# Patient Record
Sex: Male | Born: 1940
Health system: Southern US, Community
[De-identification: ages and names within clinical notes are randomized; demographics above are authoritative.]

## PROBLEM LIST (undated history)

## (undated) DIAGNOSIS — I83812 Varicose veins of left lower extremities with pain: Secondary | ICD-10-CM

## (undated) DIAGNOSIS — F419 Anxiety disorder, unspecified: Secondary | ICD-10-CM

## (undated) DIAGNOSIS — K5790 Diverticulosis of intestine, part unspecified, without perforation or abscess without bleeding: Secondary | ICD-10-CM

## (undated) DIAGNOSIS — H209 Unspecified iridocyclitis: Secondary | ICD-10-CM

## (undated) DIAGNOSIS — E7800 Pure hypercholesterolemia, unspecified: Secondary | ICD-10-CM

## (undated) DIAGNOSIS — I82409 Acute embolism and thrombosis of unspecified deep veins of unspecified lower extremity: Secondary | ICD-10-CM

## (undated) DIAGNOSIS — I2510 Atherosclerotic heart disease of native coronary artery without angina pectoris: Secondary | ICD-10-CM

## (undated) DIAGNOSIS — I452 Bifascicular block: Secondary | ICD-10-CM

## (undated) DIAGNOSIS — I209 Angina pectoris, unspecified: Secondary | ICD-10-CM

## (undated) DIAGNOSIS — G250 Essential tremor: Secondary | ICD-10-CM

## (undated) DIAGNOSIS — N400 Enlarged prostate without lower urinary tract symptoms: Secondary | ICD-10-CM

## (undated) DIAGNOSIS — Z87442 Personal history of urinary calculi: Secondary | ICD-10-CM

## (undated) DIAGNOSIS — I519 Heart disease, unspecified: Secondary | ICD-10-CM

## (undated) HISTORY — DX: Bifascicular block: I45.2

## (undated) HISTORY — PX: INGUINAL HERNIA REPAIR: SUR1180

## (undated) HISTORY — DX: Essential tremor: G25.0

## (undated) HISTORY — DX: Benign prostatic hyperplasia without lower urinary tract symptoms: N40.0

## (undated) HISTORY — DX: Heart disease, unspecified: I51.9

## (undated) HISTORY — DX: Anxiety disorder, unspecified: F41.9

## (undated) HISTORY — DX: Diverticulosis of intestine, part unspecified, without perforation or abscess without bleeding: K57.90

## (undated) HISTORY — PX: LITHOTRIPSY: SUR834

## (undated) HISTORY — PX: UMBILICAL HERNIA REPAIR: SHX196

## (undated) HISTORY — DX: Atherosclerotic heart disease of native coronary artery without angina pectoris: I25.10

## (undated) HISTORY — DX: Unspecified iridocyclitis: H20.9

---
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
## 1988-06-24 DIAGNOSIS — I82409 Acute embolism and thrombosis of unspecified deep veins of unspecified lower extremity: Secondary | ICD-10-CM

## 1988-06-24 HISTORY — DX: Acute embolism and thrombosis of unspecified deep veins of unspecified lower extremity: I82.409

## 1995-06-25 HISTORY — PX: CORONARY ANGIOPLASTY: SHX604

## 1995-06-25 HISTORY — PX: CARDIAC CATHETERIZATION: SHX172

## 1998-05-24 ENCOUNTER — Observation Stay (HOSPITAL_COMMUNITY): Admission: AD | Admit: 1998-05-24 | Discharge: 1998-05-25 | Payer: Self-pay | Admitting: Cardiology

## 2004-01-12 ENCOUNTER — Encounter: Admission: RE | Admit: 2004-01-12 | Discharge: 2004-01-12 | Payer: Self-pay | Admitting: Internal Medicine

## 2008-08-01 ENCOUNTER — Encounter: Payer: Self-pay | Admitting: Cardiology

## 2009-02-02 ENCOUNTER — Encounter: Payer: Self-pay | Admitting: Cardiology

## 2009-02-25 DIAGNOSIS — Z8672 Personal history of thrombophlebitis: Secondary | ICD-10-CM

## 2009-03-02 ENCOUNTER — Ambulatory Visit: Payer: Self-pay | Admitting: Cardiology

## 2009-03-02 DIAGNOSIS — F341 Dysthymic disorder: Secondary | ICD-10-CM | POA: Insufficient documentation

## 2009-03-02 DIAGNOSIS — Z87898 Personal history of other specified conditions: Secondary | ICD-10-CM | POA: Insufficient documentation

## 2009-03-02 DIAGNOSIS — R351 Nocturia: Secondary | ICD-10-CM

## 2009-03-16 ENCOUNTER — Telehealth (INDEPENDENT_AMBULATORY_CARE_PROVIDER_SITE_OTHER): Payer: Self-pay | Admitting: *Deleted

## 2009-03-23 ENCOUNTER — Telehealth (INDEPENDENT_AMBULATORY_CARE_PROVIDER_SITE_OTHER): Payer: Self-pay

## 2009-03-27 ENCOUNTER — Encounter (HOSPITAL_COMMUNITY): Admission: RE | Admit: 2009-03-27 | Discharge: 2009-05-26 | Payer: Self-pay | Admitting: Cardiovascular Disease

## 2009-03-27 ENCOUNTER — Ambulatory Visit: Payer: Self-pay | Admitting: Cardiovascular Disease

## 2009-03-27 ENCOUNTER — Ambulatory Visit: Payer: Self-pay

## 2009-04-06 ENCOUNTER — Ambulatory Visit: Payer: Self-pay | Admitting: Cardiovascular Disease

## 2009-04-06 ENCOUNTER — Encounter: Payer: Self-pay | Admitting: Cardiology

## 2009-04-06 ENCOUNTER — Ambulatory Visit (HOSPITAL_COMMUNITY): Admission: RE | Admit: 2009-04-06 | Discharge: 2009-04-06 | Payer: Self-pay | Admitting: Cardiovascular Disease

## 2009-04-06 ENCOUNTER — Ambulatory Visit: Payer: Self-pay

## 2009-04-13 ENCOUNTER — Telehealth: Payer: Self-pay | Admitting: Cardiology

## 2011-04-08 ENCOUNTER — Other Ambulatory Visit: Payer: Self-pay | Admitting: Gastroenterology

## 2011-04-08 DIAGNOSIS — R1030 Lower abdominal pain, unspecified: Secondary | ICD-10-CM

## 2011-04-10 ENCOUNTER — Ambulatory Visit
Admission: RE | Admit: 2011-04-10 | Discharge: 2011-04-10 | Disposition: A | Discharge status: Home / Self Care | Payer: Medicare Other | Source: Ambulatory Visit | Attending: Gastroenterology | Admitting: Gastroenterology

## 2011-04-10 DIAGNOSIS — R1030 Lower abdominal pain, unspecified: Secondary | ICD-10-CM

## 2011-04-10 MED ORDER — IOHEXOL 300 MG/ML  SOLN: 100.0000 mL | Freq: Once | INTRAMUSCULAR | Status: AC | PRN

## 2011-05-13 ENCOUNTER — Other Ambulatory Visit: Payer: Self-pay | Admitting: Internal Medicine

## 2011-05-13 DIAGNOSIS — I83813 Varicose veins of bilateral lower extremities with pain: Secondary | ICD-10-CM

## 2011-05-29 ENCOUNTER — Other Ambulatory Visit: Payer: Self-pay | Admitting: Internal Medicine

## 2011-05-29 ENCOUNTER — Other Ambulatory Visit: Payer: Medicare Other

## 2011-05-29 ENCOUNTER — Ambulatory Visit
Admission: RE | Admit: 2011-05-29 | Discharge: 2011-05-29 | Disposition: A | Discharge status: Home / Self Care | Payer: Medicare Other | Source: Ambulatory Visit | Attending: Internal Medicine | Admitting: Internal Medicine

## 2011-05-29 VITALS — BP 150/83 | HR 85 | Temp 98.0°F | Resp 14 | Ht 72.0 in | Wt 195.0 lb

## 2011-05-29 DIAGNOSIS — I83813 Varicose veins of bilateral lower extremities with pain: Secondary | ICD-10-CM

## 2011-05-29 HISTORY — DX: Angina pectoris, unspecified: I20.9

## 2011-05-29 HISTORY — DX: Acute embolism and thrombosis of unspecified deep veins of unspecified lower extremity: I82.409

## 2011-05-29 HISTORY — DX: Varicose veins of left lower extremity with pain: I83.812

## 2011-05-29 HISTORY — DX: Pure hypercholesterolemia, unspecified: E78.00

## 2011-06-05 ENCOUNTER — Telehealth: Payer: Self-pay | Admitting: Emergency Medicine

## 2011-06-05 NOTE — Telephone Encounter (Signed)
SPOKE W/ PT RE: NO AUTHO FOR MEDICARE OR BCBC FOR THE VARICOSE VEIN LASER [place].HIS BCBS WILL FOLLOW MEDICARE GUIDELINES AFTER WE FILE THE CLAIM.  PT DECIDED TO THINK ABOUT IT AND WILL TRY THE COMPRESSION STOCKINGS FIRST TO SEE IF THEY HELP ELEVATE THE SYMPTOMS IN HIS LEGS.  HE WILL CALL us NEXT YEAR WHEN HE IS READY TO SCHEDULE.

## 2011-07-11 ENCOUNTER — Telehealth: Payer: Self-pay | Admitting: Emergency Medicine

## 2011-07-11 NOTE — Telephone Encounter (Signed)
Pt. Can not get the compression stockings on his legs and has decided to forfeit the laser treatment until further notice.  He will contact us in the future if things change.

## 2012-01-03 ENCOUNTER — Ambulatory Visit (INDEPENDENT_AMBULATORY_CARE_PROVIDER_SITE_OTHER): Payer: Medicare Other | Admitting: Family Medicine

## 2012-01-03 VITALS — BP 132/76 | HR 76 | Temp 98.1°F | Resp 16 | Ht 72.0 in | Wt 189.0 lb

## 2012-01-03 DIAGNOSIS — M25579 Pain in unspecified ankle and joints of unspecified foot: Secondary | ICD-10-CM

## 2012-01-03 DIAGNOSIS — L03031 Cellulitis of right toe: Secondary | ICD-10-CM

## 2012-01-03 DIAGNOSIS — L03039 Cellulitis of unspecified toe: Secondary | ICD-10-CM

## 2012-01-03 MED ORDER — DOXYCYCLINE HYCLATE 100 MG PO TABS: 100.0000 mg | ORAL_TABLET | Freq: Two times a day (BID) | ORAL | Status: AC

## 2012-01-03 NOTE — Progress Notes (Signed)
This 71 year old retired man who comes in with a right toe pain. He tried to clean out the edge of the cuticle and putting Neosporin several days ago and then the whole toe got red and painful.  Objective: Patient has paronychia around the right great toe    areas prepped with Betadine and the paronychia was incised with a #11 blade releasing or abscess 1-2 mL of thick yellow pus which was cultured. He had relief. Here he was then massaged to empty all the pus and the wound distress.  Assessment: Paronychia  Plan: Doxycycline and return as needed

## 2012-01-03 NOTE — Addendum Note (Signed)
Addended by: Maryann Alar on: 01/03/2012 04:19 PM   Modules accepted: Orders

## 2012-01-06 LAB — WOUND CULTURE
Gram Stain: NONE SEEN
Organism ID, Bacteria: NO GROWTH

## 2012-06-12 ENCOUNTER — Other Ambulatory Visit: Payer: Self-pay | Admitting: Neurology

## 2012-06-12 DIAGNOSIS — M839 Adult osteomalacia, unspecified: Secondary | ICD-10-CM

## 2012-06-22 ENCOUNTER — Ambulatory Visit
Admission: RE | Admit: 2012-06-22 | Discharge: 2012-06-22 | Disposition: A | Discharge status: Home / Self Care | Payer: Medicare Other | Source: Ambulatory Visit | Attending: Neurology | Admitting: Neurology

## 2012-06-22 DIAGNOSIS — M839 Adult osteomalacia, unspecified: Secondary | ICD-10-CM

## 2012-09-23 ENCOUNTER — Telehealth: Payer: Self-pay | Admitting: Neurology

## 2012-09-23 NOTE — Telephone Encounter (Signed)
Pt called and is asking about regimen for tapering his primidone for his tremor.  I instructed him to take 1/2 tab (250mg  tabs) in am, 1 tab pm for 3 wks, then 1/2 tab po bid for 3 wks, then 1/2 tab po qd for 3 wks then discontinue.  He verbalized understanding.

## 2012-10-26 ENCOUNTER — Telehealth: Payer: Self-pay | Admitting: Neurology

## 2012-10-28 ENCOUNTER — Telehealth: Payer: Self-pay | Admitting: Neurology

## 2012-10-28 DIAGNOSIS — G250 Essential tremor: Secondary | ICD-10-CM

## 2012-10-28 MED ORDER — PRIMIDONE 250 MG PO TABS: 250.0000 mg | ORAL_TABLET | Freq: Two times a day (BID) | ORAL | Status: DC

## 2012-10-28 NOTE — Telephone Encounter (Signed)
Patient requests a call re his medication.

## 2012-10-28 NOTE — Telephone Encounter (Signed)
I called pt and he is asking about restarting the primidone again since he has titrated down to 1/2 tab once daily, he has noticed shaking.   I instructed him to start taking the 1/2 tab po bid (primidone 250mg  tabs) until 11/09/12 then increase to 1/2 tab am and 1 tab po pm, then on 11/23/12 take 1 tab po bid.  (this is 2 wk intervals).  He is to remain at 250mg  po bid.  He will call if problems.  I refilled his primidone 250mg  po bid # 60 RF x 2.  Pt has appt on 12/2012 with Dr. Frances Furbish.

## 2013-01-01 ENCOUNTER — Ambulatory Visit (INDEPENDENT_AMBULATORY_CARE_PROVIDER_SITE_OTHER): Payer: Medicare Other | Admitting: Neurology

## 2013-01-01 ENCOUNTER — Encounter: Payer: Self-pay | Admitting: Neurology

## 2013-01-01 VITALS — BP 129/82 | HR 69 | Temp 98.2°F | Ht 72.0 in | Wt 188.0 lb

## 2013-01-01 DIAGNOSIS — G250 Essential tremor: Secondary | ICD-10-CM

## 2013-01-01 NOTE — Patient Instructions (Addendum)
I think overall you are doing fairly well but I do want to suggest a few things today:   Remember to drink plenty of fluid, eat healthy meals and do not skip any meals. Try to eat protein with a every meal and eat a healthy snack such as fruit or nuts in between meals. Try to keep a regular sleep-wake schedule and try to exercise daily, particularly in the form of walking, 20-30 minutes a day, if you can.   Engage in social activities in your community and with your family and try to keep up with current events by reading the newspaper or watching the news.   As far as your medications are concerned, I would like to suggest no changes. Let me know, if you wish to seek consultation for DBS placement. We can refer you to [facility] or [facility]. If you wish to take a smaller dose of Primidone at midday, ie. 50 mg, let me know and we can call it in for you.    As far as diagnostic testing: no new test.  I would like to see you back in 6 months, sooner if we need to. Please call us with any interim questions, concerns, problems, updates or refill requests.  Brett Canales is my clinical assistant and will answer any of your questions and relay your messages to me and also relay most of my messages to you.  Our phone number is [phone]. We also have an after hours call service for urgent matters and there is a physician on-call for urgent questions. For any emergencies you know to call 911 or go to the nearest emergency room.

## 2013-01-01 NOTE — Progress Notes (Signed)
Subjective:    Patient ID: Joseph Hood is a 72 y.o. male.  HPI  Interim history:   Joseph Hood is a very pleasant 72 year old right-handed gentleman who presents for followup consultation of his essential tremor. He is unaccompanied today. This is his first visit with me and she previously followed with Dr. Fayrene Fearing love and was last seen by him on 09/01/2012, and which time Dr. Sandria Manly felt that there was no significant improvement in his tremor on primidone 250 mg twice daily. He decided to taper the medication and discontinue. Because of history of kidney stones he did not suggest methazolamide. The patient has an underlying medical history of heart disease, s/p stent placement in 1996, and kidney stones. He's currently on Zoloft half a tablet daily, baby aspirin, simvastatin, multivitamin.   I reviewed Dr. Imagene Gurney prior notes and the patient's records and below is a summary of that review:  72 year old right-handed gentleman with a long-standing history of essential tremor. He tried Inderal without benefit as well as primidone without benefit. There is a strong family history on his father's side of tremor but his son and daughter have no tremor. He noticed that alcohol decreased tremor. He denies memory loss and has no history of thyroid disease. He tried coming off of primidone, realized that it was helpful and is now back on it. He quit drinking alcohol altogether some 10 days ago and did not note much worsening.  His Past Medical History Is Significant For: Past Medical History  Diagnosis Date  . Angina   . Kidney stones 1992  . Hypercholesterolemia   . Varicose veins of left lower extremity with pain   . DVT (deep venous thrombosis) 1990    LLE  . Heart disease     His Past Surgical History Is Significant For: Past Surgical History  Procedure Laterality Date  . Cardiac catheterization  1997  . Coronary angioplasty  1997  . Appendectomy  1951    His Family History Is  Significant For: History reviewed. No pertinent family history.  His Social History Is Significant For: History   Social History  . Marital Status: Widowed    Spouse Name: N/A    Number of Children: N/A  . Years of Education: N/A   Social History Main Topics  . Smoking status: Former Smoker -- 2.00 packs/day for 35 years    Types: Cigarettes    Quit date: 06/29/1991  . Smokeless tobacco: Never Used  . Alcohol Use: Yes     Comment: 2-3 beers daily  . Drug Use: None  . Sexually Active: None   Other Topics Concern  . None   Social History Narrative   The patient is a widower with 1 year of college.His wife had a hemorrhagic stroke, but did well for 3 years  and then had metastatic disease to her brain and  died 49/10.  He has 2 children. He is retired and lives at home. He used to smoke 2 packs of cigarettes a day but quit 07/09/1992.  He drinks 3-4 beers a day and 1 diet coke.    His Allergies Are:  No Known Allergies:   His Current Medications Are:  Outpatient Encounter Prescriptions as of 01/01/2013  Medication Sig Dispense Refill  . aspirin 81 MG tablet Take 81 mg by mouth daily.        . Multiple Vitamin (MULTIVITAMIN) tablet Take 1 tablet by mouth daily.        . primidone (MYSOLINE) 250 MG  tablet Take 1 tablet (250 mg total) by mouth 2 (two) times daily. Titrate as instructed until 250mg  po twice daily.  60 tablet  2  . sertraline (ZOLOFT) 25 MG tablet Take 25 mg by mouth daily.      . simvastatin (ZOCOR) 20 MG tablet Take 20 mg by mouth at bedtime.         No facility-administered encounter medications on file as of 01/01/2013.  : Review of Systems  Neurological: Positive for tremors.    Objective:  Neurologic Exam  Physical Exam Physical Examination:   Filed Vitals:   01/01/13 1212  BP: 129/82  Pulse: 69  Temp: 98.2 F (36.8 C)    General Examination: The patient is a very pleasant 72 y.o. male in no acute distress. He appears well-developed and  well-nourished and well groomed.   HEENT: Normocephalic, atraumatic, pupils are equal, round and reactive to light and accommodation. Extraocular tracking is good without limitation to gaze excursion or nystagmus noted. Normal smooth pursuit is noted. Hearing is grossly intact. Tympanic membranes are clear bilaterally. Face is symmetric with normal facial animation and normal facial sensation. Speech is clear with no dysarthria noted. There is no hypophonia. There is a mild to moderate degree of lower lip and lower jaw tremor. He has no significant head tremor. The tremor is for the most part intermittent. Neck is supple with full range of passive and active motion. There are no carotid bruits on auscultation. Oropharynx exam reveals: mild mouth dryness, adequate dental hygiene and mild airway crowding. Mallampati is class II. Tongue protrudes centrally and palate elevates symmetrically.    Chest: Clear to auscultation without wheezing, rhonchi or crackles noted.  Heart: S1+S2+0, regular and normal without murmurs, rubs or gallops noted.   Abdomen: Soft, non-tender and non-distended with normal bowel sounds appreciated on auscultation.  Extremities: There is no pitting edema in the distal lower extremities bilaterally. Pedal pulses are intact.  Skin: Warm and dry without trophic changes noted. There are no varicose veins, on the left but he has prominent varicose veins and hyperpigmented areas on the right. He says that this is from a long time ago.  Musculoskeletal: exam reveals no obvious joint deformities, tenderness or joint swelling or erythema.   Neurologically:  Mental status: The patient is awake, alert and oriented in all 4 spheres. His memory, attention, language and knowledge are appropriate. There is no aphasia, agnosia, apraxia or anomia. Speech is clear with normal prosody and enunciation. Thought process is linear. Mood is congruent and affect is normal.  Cranial nerves are as  described above under HEENT exam. In addition, shoulder shrug is normal with equal shoulder height noted. Motor exam: Normal bulk, strength and tone is noted. There is no drift, or rebound. He has a moderate degree of postural and action tremor in both upper extremities. He has an intermittent resting tremor in both upper extremities. There tremor seems to be slightly worse on the left. Fine motor skills are mildly impaired in the upper and lower extremities. Romberg is negative. Reflexes are 2+ throughout.   Cerebellar testing shows no dysmetria or intention tremor on finger to nose testing. There is no truncal or gait ataxia.  Sensory exam is intact to light touch, pinprick, vibration, temperature sense and proprioception in the upper and lower extremities.  Gait, station and balance are unremarkable. No veering to one side is noted. No leaning to one side is noted. Posture is age-appropriate and stance is narrow based. No problems  turning are noted. Arm swing is preserved.  Assessment and Plan:   Assessment and Plan:  In summary, Joseph Hood is a very pleasant 72 y.o.-year old male with a long-standing history of essential tremor, this is affecting his upper extremities and his lower jaw. He recently tried to discontinue Mysoline but realized that it was indeed helpful and is now back on 250 mg twice daily with good tolerance and fair control of his tremor to the point where he can function. Long discussion with him today regarding his symptoms and his presentation. We also talked about DBS surgery. However, at this time he is reluctant to pursue this. I did explain the procedure to him and the possibility of getting a consultation perhaps at [facility] or weight for should worsen. He is encouraged to think about it. Thankfully he does not have much in the way of other medical problems but he does have chronic back pain. This has prevented him from being physically as active as he would like. Today I  suggested perhaps adding a smaller dose of Mysoline namely 50 mg strength at midday but he would like to hold off at this time. He does realize that Zoloft even though it is a small dose can also contribute to tremors. Today I suggested a six-month followup, sooner if the need arises. He is encouraged to call us for any refills or if he wishes to try a smaller dose of Mysoline at midday. Also, if he wishes to proceed with consultation for DBS we can arrange for a referral. He was in agreement.

## 2013-02-09 ENCOUNTER — Other Ambulatory Visit: Payer: Self-pay

## 2013-02-09 DIAGNOSIS — G250 Essential tremor: Secondary | ICD-10-CM

## 2013-02-09 MED ORDER — PRIMIDONE 250 MG PO TABS: 250.0000 mg | ORAL_TABLET | Freq: Two times a day (BID) | ORAL | Status: DC

## 2013-07-05 ENCOUNTER — Ambulatory Visit: Payer: Medicare Other | Admitting: Neurology

## 2013-08-05 ENCOUNTER — Encounter: Payer: Self-pay | Admitting: Neurology

## 2013-08-22 HISTORY — PX: CATARACT EXTRACTION: SUR2

## 2013-09-08 ENCOUNTER — Other Ambulatory Visit: Payer: Self-pay | Admitting: Neurology

## 2013-09-14 ENCOUNTER — Ambulatory Visit: Payer: Medicare Other | Admitting: Neurology

## 2013-12-01 ENCOUNTER — Other Ambulatory Visit: Payer: Self-pay | Admitting: Neurology

## 2013-12-21 ENCOUNTER — Ambulatory Visit (INDEPENDENT_AMBULATORY_CARE_PROVIDER_SITE_OTHER): Payer: BLUE CROSS/BLUE SHIELD | Admitting: Neurology

## 2013-12-21 ENCOUNTER — Encounter: Payer: Self-pay | Admitting: Neurology

## 2013-12-21 VITALS — BP 126/68 | HR 72 | Resp 16 | Ht 72.0 in | Wt 187.0 lb

## 2013-12-21 DIAGNOSIS — G250 Essential tremor: Secondary | ICD-10-CM

## 2013-12-21 DIAGNOSIS — G252 Other specified forms of tremor: Principal | ICD-10-CM | POA: Insufficient documentation

## 2013-12-21 DIAGNOSIS — G609 Hereditary and idiopathic neuropathy, unspecified: Secondary | ICD-10-CM

## 2013-12-21 NOTE — Progress Notes (Signed)
Subjective:    Joseph Hood was seen in consultation in the movement disorder clinic at the request of [name], MD.  The evaluation is for tremor.  The patient has previously seen Dr. [name] as well as Dr. [name].  The patient was last seen by Dr. [name] in July, 2014.  I had the opportunity to review those notes.  The pt is R hand dominant.  Pt states that his father and paternal grandfather had tremor as well and his sister has tremor.  He first noted tremor in his 70's.  The L hand shakes worse than the R.  In the past, the patient has tried propranolol, but that did not seem to help.  I do not know the dose that the patient tried.  The patient did not think that primidone, 250 mg twice a day helped, but according to notes it was attempted to discontinue the medication and tremor got worse and the medication was reinitiated.  Dr. [name] did talk to the patient about DBS therapy and offered consults at [facility].  The patient does have a history of nephrolithiasis.  Affected by caffeine:  No. Affected by alcohol:  Yes.  , if drinks wine (states that he was drinking 3 beers per day but stopped drinking for 39 days to see if that would help tremor but noted no difference at all so resumed drinking at previous level) Affected by stress:  Yes.   Affected by fatigue:  No. Spills soup if on spoon:  Usually not if can grip spoon tightly but has to be very careful with it Spills glass of liquid if full:  Yes.   Affects ADL's (tying shoes, brushing teeth, etc):  No. (uses electric razor but always has)  Current/Previously tried tremor medications: Propranolol, unknown dose; primidone; no Topamax due to history of nephrolithiasis x2  Current medications that may exacerbate tremor:  None  Outside reports reviewed: historical medical records, lab reports and referral letter/letters.  Allergies  Allergen Reactions  . Augmentin [Amoxicillin-Pot Clavulanate]   . Tetracyclines &  Related     Current Outpatient Prescriptions on File Prior to Visit  Medication Sig Dispense Refill  . aspirin 81 MG tablet Take 81 mg by mouth daily.        . Multiple Vitamin (MULTIVITAMIN) tablet Take 1 tablet by mouth daily.        . primidone (MYSOLINE) 250 MG tablet TAKE 1 TABLET BY MOUTH TWICE DAILY  180 tablet  0  . sertraline (ZOLOFT) 50 MG tablet Take 25 mg by mouth daily.       . simvastatin (ZOCOR) 20 MG tablet Take 20 mg by mouth at bedtime.        No current facility-administered medications on file prior to visit.    Past Medical History  Diagnosis Date  . Angina   . Kidney stones 1992  . Hypercholesterolemia   . Varicose veins of left lower extremity with pain   . DVT (deep venous thrombosis) 1990    LLE  . Heart disease   . Coronary artery disease   . BPH (benign prostatic hyperplasia)   . Anxiety   . Tremor   . RBBB (right bundle branch block with left anterior fascicular block)   . Diverticulosis     Past Surgical History  Procedure Laterality Date  . Cardiac catheterization  1997  . Coronary angioplasty  1997  . Appendectomy  1951  . Umbilical hernia repair    . Inguinal  hernia repair Bilateral     History   Social History  . Marital Status: Widowed    Spouse Name: N/A    Number of Children: N/A  . Years of Education: N/A   Occupational History  . Not on file.   Social History Main Topics  . Smoking status: Former Smoker -- 2.00 packs/day for 35 years    Types: Cigarettes    Quit date: 06/29/1991  . Smokeless tobacco: Never Used  . Alcohol Use: Yes     Comment: 2-3 beers daily  . Drug Use: No  . Sexual Activity: Not on file   Other Topics Concern  . Not on file   Social History Narrative   The patient is a widower with 1 year of college.His wife had a hemorrhagic stroke, but did well for 3 years  and then had metastatic disease to her brain and  died 68/10.  He has 2 children. He is retired and lives at home. He used to smoke 2 packs  of cigarettes a day but quit 07/09/1992.  He drinks 3-4 beers a day and 1 diet coke.    Family Status  Relation Status Death Age  . Mother Deceased     heart disease  . Father Deceased     heart disease  . Daughter Alive     healthy  . Son Alive     healthy  . Sister Alive     healthy    Review of Systems A complete 10 system ROS was obtained and was negative apart from what is mentioned.   Objective:   VITALS:   Filed Vitals:   12/21/13 0900  BP: 126/68  Pulse: 72  Resp: 16  Height: 6' (1.829 m)  Weight: 187 lb (84.823 kg)   Gen:  Appears stated age and in NAD. HEENT:  Normocephalic, atraumatic. The mucous membranes are moist. The superficial temporal arteries are without ropiness or tenderness. Cardiovascular: Regular rate and rhythm. Lungs: Clear to auscultation bilaterally. Neck: There are no carotid bruits noted bilaterally.  NEUROLOGICAL:  Orientation:  The patient is alert and oriented x 3.  Recent and remote memory are intact.  Attention span and concentration are normal.  Able to name objects and repeat without trouble.  Fund of knowledge is appropriate Cranial nerves: There is good facial symmetry. The pupils are equal round and reactive to light bilaterally. Fundoscopic exam reveals clear disc margins bilaterally. Extraocular muscles are intact and visual fields are full to confrontational testing. Speech is fluent and clear. Soft palate rises symmetrically and there is no tongue deviation. Hearing is intact to conversational tone. Tone: Tone is good throughout. Sensation: Sensation is intact to light touch and pinprick throughout (facial, trunk, extremities).  Vibration is intact but decreased at the bilateral big toe. There is no extinction with double simultaneous stimulation. There is no sensory dermatomal level identified. Coordination:  The patient has no dysdiadichokinesia or dysmetria. Motor: Strength is 5/5 in the bilateral upper and lower extremities.   Shoulder shrug is equal bilaterally.  There is no pronator drift.  There are no fasciculations noted. DTR's: Deep tendon reflexes are 2/4 at the bilateral biceps, triceps, brachioradialis, patella and 1/4 at the bilateral achilles.  Plantar responses are downgoing bilaterally. Gait and Station: The patient is able to ambulate without difficulty. The patient is able to heel toe walk without any difficulty. The patient has difficulty ambulating in a tandem fashion. The patient is able to stand in the Romberg position.  MOVEMENT EXAM: Tremor:  There is tremor in the UE, noted most significantly with action.  The patient is not able to draw Archimedes spirals without significant difficulty.  There is mild tremor at rest.  The patient is not able to pour water from one glass to another without spilling it.  There is significant jaw tremor.  There is lower extremity tremor bilaterally as well.       Assessment/Plan:   1.  Essential Tremor, moderately severe.  -This is evidenced by the symmetrical nature and longstanding hx of gradually getting worse.  There is also a strong family history.  I think it is unlikely that we will get significant control over tremor without DBS surgery, and we talked about this today.  He is not thrilled about the idea of the surgery, which I understand, but I did educate him on the risks and benefits of surgery as well as details.  Patient education was also provided as well as a patient DVD.  If we were to consider surgery, I think that he would also likely need to decrease alcohol intake.  -He is currently on primidone, 250 mg twice a day.  I do not think going up on the medication further we'll add additional benefit.  We talked about adding additional medication, such as Neurontin.  We talked about Artane as well.  The risks of Artane would certainly be cognitive dulling in this age group, in addition to the anticholinergic side effects of gait instability (which he already  has because of peripheral neuropathy) and drying of eyes/mouth.  He asked me to write these things down for him and he was going to think about these.  He is not a candidate for Topamax because of history of nephrolithiasis.  I do not know the previous dose of her prior log that he had tried, but it was reported that he had failed that.  He reports that stress is actually much less than it has been over the last few months and he thinks that tremor will be somewhat decreased because of that, so he wants to just stay in the primidone for the next few months and see how he does. 2.  peripheral neuropathy on examination  -This is likely due to alcohol use.  It is recommended this be discontinued or tapered significantly.  Safety was discussed.  He recently moved into a one-story home because of falls (one of which he believes was likely due to drunkenness) and instability. 3.  Return in about 3 months (around 03/23/2014) for end of day appt.  Greater than 50% of 60 min visit in counseling as above.

## 2013-12-21 NOTE — Patient Instructions (Signed)
1.  You can think about whether or not you would potentially like to add either gabapentin (neurontin) or artane next visit for further control of tremor.  I would stay on primidone - 250 mg twice per day for now 2.  Watch patient DVD on surgery for tremor

## 2014-03-15 ENCOUNTER — Other Ambulatory Visit: Payer: Self-pay | Admitting: Neurology

## 2014-03-15 ENCOUNTER — Telehealth: Payer: Self-pay | Admitting: Neurology

## 2014-03-15 NOTE — Telephone Encounter (Signed)
Received request from [facility] for Primidone 250 mg BID. Previously prescribed by another provider. Okay to fill?

## 2014-03-15 NOTE — Telephone Encounter (Signed)
okay

## 2014-03-16 MED ORDER — PRIMIDONE 250 MG PO TABS: 250.0000 mg | ORAL_TABLET | Freq: Two times a day (BID) | ORAL | Status: DC

## 2014-03-16 NOTE — Telephone Encounter (Signed)
RX sent to pharmacy  

## 2014-03-22 ENCOUNTER — Ambulatory Visit: Payer: BLUE CROSS/BLUE SHIELD | Admitting: Neurology

## 2014-03-22 ENCOUNTER — Telehealth: Payer: Self-pay | Admitting: Neurology

## 2014-03-22 NOTE — Telephone Encounter (Signed)
Pt cancelled today's appt with the answering service. We will call pt to r/s appt as requested. / Sherri S.

## 2014-03-23 NOTE — Telephone Encounter (Signed)
appt marked as no show instead of cancelled appt since pt called the day of to r/s appt / Sherri S.

## 2014-03-28 ENCOUNTER — Ambulatory Visit: Payer: BLUE CROSS/BLUE SHIELD | Admitting: Neurology

## 2014-03-29 ENCOUNTER — Ambulatory Visit (INDEPENDENT_AMBULATORY_CARE_PROVIDER_SITE_OTHER): Payer: BLUE CROSS/BLUE SHIELD | Admitting: Neurology

## 2014-03-29 ENCOUNTER — Encounter: Payer: Self-pay | Admitting: Neurology

## 2014-03-29 VITALS — BP 136/66 | HR 84 | Ht 72.0 in | Wt 187.0 lb

## 2014-03-29 DIAGNOSIS — R251 Tremor, unspecified: Secondary | ICD-10-CM

## 2014-03-29 DIAGNOSIS — G252 Other specified forms of tremor: Principal | ICD-10-CM

## 2014-03-29 DIAGNOSIS — G250 Essential tremor: Secondary | ICD-10-CM

## 2014-03-29 NOTE — Progress Notes (Signed)
;Subjective:    Joseph Hood was seen in consultation in the movement disorder clinic at the request of [name], MD.  The evaluation is for tremor.  The patient has previously seen Dr. [name] as well as Dr. [name].  The patient was last seen by Dr. [name] in July, 2014.  I had the opportunity to review those notes.  The pt is R hand dominant.  Pt states that his father and paternal grandfather had tremor as well and his sister has tremor.  He first noted tremor in his 55's.  The L hand shakes worse than the R.  In the past, the patient has tried propranolol, but that did not seem to help.  I do not know the dose that the patient tried.  The patient did not think that primidone, 250 mg twice a day helped, but according to notes it was attempted to discontinue the medication and tremor got worse and the medication was reinitiated.  Dr. [name] did talk to the patient about DBS therapy and offered consults at [facility].  The patient does have a history of nephrolithiasis.  03/29/14 update:  Pt returns today for follow up.  States that he has thought about his options and he doesn't want DBS.  He was dx with anterior uveitis since last visit and a f/u visit yesterday. He was told that he had swelling of the retina.   He is being referred to a specialist for it and wants to wait until that gets cleared up before he takes new medication.  He has trouble putting the eye drops in because of the tremor.  He remains on primidone 250 mg bid and is still drinking miller light 3-5 cans per day but states that some days he doesn't drink at all.     Current/Previously tried tremor medications: Propranolol, unknown dose; primidone; no Topamax due to history of nephrolithiasis x2  Current medications that may exacerbate tremor:  None  Outside reports reviewed: historical medical records, lab reports and referral letter/letters.  Allergies  Allergen Reactions  . Augmentin [Amoxicillin-Pot  Clavulanate]   . Tetracyclines & Related     Current Outpatient Prescriptions on File Prior to Visit  Medication Sig Dispense Refill  . aspirin 81 MG tablet Take 81 mg by mouth daily.        . Multiple Vitamin (MULTIVITAMIN) tablet Take 1 tablet by mouth daily.        . primidone (MYSOLINE) 250 MG tablet Take 1 tablet (250 mg total) by mouth 2 (two) times daily.  180 tablet  1  . sertraline (ZOLOFT) 50 MG tablet Take 25 mg by mouth daily.       . simvastatin (ZOCOR) 20 MG tablet Take 20 mg by mouth at bedtime.        No current facility-administered medications on file prior to visit.    Past Medical History  Diagnosis Date  . Angina   . Kidney stones 1992  . Hypercholesterolemia   . Varicose veins of left lower extremity with pain   . DVT (deep venous thrombosis) 1990    LLE  . Heart disease   . Coronary artery disease   . BPH (benign prostatic hyperplasia)   . Anxiety   . Essential tremor   . RBBB (right bundle branch block with left anterior fascicular block)   . Diverticulosis   . Uveitis     Past Surgical History  Procedure Laterality Date  . Cardiac catheterization  1997  .  Coronary angioplasty  1997  . Appendectomy  1951  . Umbilical hernia repair    . Inguinal hernia repair Bilateral   . Lithotripsy    . Cataract extraction Right 08/2013    History   Social History  . Marital Status: Widowed    Spouse Name: N/A    Number of Children: N/A  . Years of Education: N/A   Occupational History  . retired    Social History Main Topics  . Smoking status: Former Smoker -- 2.00 packs/day for 35 years    Types: Cigarettes    Quit date: 06/29/1991  . Smokeless tobacco: Never Used  . Alcohol Use: Yes     Comment: 2-3 beers daily  . Drug Use: No  . Sexual Activity: Not on file   Other Topics Concern  . Not on file   Social History Narrative   The patient is a widower with 1 year of college.His wife had a hemorrhagic stroke, but did well for 3 years  and  then had metastatic disease to her brain and  died 24/10.  He has 2 children. He is retired and lives at home. He used to smoke 2 packs of cigarettes a day but quit 07/09/1992.  He drinks 3-4 beers a day and 1 diet coke.    Family Status  Relation Status Death Age  . Mother Deceased     heart disease  . Father Deceased     heart disease, EtOHism  . Daughter Alive     healthy  . Son Alive     healthy  . Sister Alive     tremor, breast CA    Review of Systems A complete 10 system ROS was obtained and was negative apart from what is mentioned.   Objective:   VITALS:   Filed Vitals:   03/29/14 1437  BP: 136/66  Pulse: 84  Height: 6' (1.829 m)  Weight: 187 lb (84.823 kg)   Gen:  Appears stated age and in NAD. HEENT:  Normocephalic, atraumatic. The mucous membranes are moist. The superficial temporal arteries are without ropiness or tenderness. Cardiovascular: Regular rate and rhythm. Lungs: Clear to auscultation bilaterally. Neck: There are no carotid bruits noted bilaterally.  NEUROLOGICAL:  Orientation:  The patient is alert and oriented x 3.  Recent and remote memory are intact.  Attention span and concentration are normal.  Able to name objects and repeat without trouble.  Fund of knowledge is appropriate Cranial nerves: There is good facial symmetry. The pupils are equal round and reactive to light bilaterally. Fundoscopic exam reveals clear disc margins bilaterally. Extraocular muscles are intact and visual fields are full to confrontational testing. Speech is fluent and clear. Soft palate rises symmetrically and there is no tongue deviation. Hearing is intact to conversational tone. Tone: Tone is good throughout. Sensation: Sensation is intact to light touch throughout. Coordination:  The patient has no dysdiadichokinesia or dysmetria. Motor: Strength is 5/5 in the bilateral upper and lower extremities.  Shoulder shrug is equal bilaterally.  There is no pronator drift.   There are no fasciculations noted. Gait and Station: The patient is able to ambulate without difficulty.   MOVEMENT EXAM: Tremor:  There is tremor in the UE, noted most significantly with action.  The patient  Still has difficulty with Archimedes spirals. There is mild tremor at rest.   There is significant jaw tremor.  There is lower extremity tremor bilaterally as well.       Assessment/Plan:  1.  Essential Tremor, moderately severe.  -This is evidenced by the symmetrical nature and longstanding hx of gradually getting worse.  There is also a strong family history.  I think it is unlikely that we will get significant control over tremor without DBS surgery, and we talked about this today.  He has researched this and thought about it, and really is not interested in DBS surgery.  He thinks that he would like to try Neurontin, but does not want to try a right now since he is dealing with uveitis.  I asked him just to call me back and let me know when he would like to try it.  I did tell him, however, that he should not expect to get complete control over tremor with this medication and, at best, the hope would be to dampen down tremor somewhat.   2.  peripheral neuropathy on examination  -This is likely due to alcohol use.  It is recommended this be discontinued or tapered significantly.   3.  we will base followup depending on when he starts the medication.

## 2014-03-29 NOTE — Patient Instructions (Signed)
Call us if you are interested in trying Gabapentin.  Let us know when you want to follow up.

## 2014-09-05 ENCOUNTER — Other Ambulatory Visit: Payer: Self-pay | Admitting: Neurology

## 2014-09-06 NOTE — Telephone Encounter (Signed)
Primidone refill requested. Per last office note- patient to remain on medication. Refill approved and sent to patient's pharmacy.   

## 2015-02-17 ENCOUNTER — Other Ambulatory Visit: Payer: Self-pay | Admitting: Neurology

## 2015-02-17 NOTE — Telephone Encounter (Signed)
Patient is following up PRN/ medication not mentioned in previous note. Please advise.

## 2015-02-20 NOTE — Telephone Encounter (Signed)
One month only and needs f/u within that time.  Haven't seen him in almost a year

## 2015-02-20 NOTE — Telephone Encounter (Signed)
One month refill sent to pharmacy with note that patient needs appt

## 2015-03-28 ENCOUNTER — Encounter: Payer: Self-pay | Admitting: Neurology

## 2015-03-28 ENCOUNTER — Ambulatory Visit (INDEPENDENT_AMBULATORY_CARE_PROVIDER_SITE_OTHER): Payer: PPO | Admitting: Neurology

## 2015-03-28 VITALS — BP 130/90 | HR 70 | Ht 72.0 in | Wt 196.0 lb

## 2015-03-28 DIAGNOSIS — G250 Essential tremor: Secondary | ICD-10-CM

## 2015-03-28 DIAGNOSIS — G609 Hereditary and idiopathic neuropathy, unspecified: Secondary | ICD-10-CM | POA: Diagnosis not present

## 2015-03-28 MED ORDER — PRIMIDONE 250 MG PO TABS: 250.0000 mg | ORAL_TABLET | Freq: Two times a day (BID) | ORAL | Status: DC

## 2015-03-28 MED ORDER — GABAPENTIN 300 MG PO CAPS: 300.0000 mg | ORAL_CAPSULE | Freq: Two times a day (BID) | ORAL | Status: DC

## 2015-03-28 NOTE — Progress Notes (Signed)
;Subjective:    Joseph Hood was seen in consultation in the movement disorder clinic at the request of [name], MD.  The evaluation is for tremor.  The patient has previously seen Dr. [name] as well as Dr. [name].  The patient was last seen by Dr. [name] in July, 2014.  I had the opportunity to review those notes.  The pt is R hand dominant.  Pt states that his father and paternal grandfather had tremor as well and his sister has tremor.  He first noted tremor in his 9's.  The L hand shakes worse than the R.  In the past, the patient has tried propranolol, but that did not seem to help.  I do not know the dose that the patient tried.  The patient did not think that primidone, 250 mg twice a day helped, but according to notes it was attempted to discontinue the medication and tremor got worse and the medication was reinitiated.  Dr. [name] did talk to the patient about DBS therapy and offered consults at [facility].  The patient does have a history of nephrolithiasis.  03/29/14 update:  Pt returns today for follow up.  States that he has thought about his options and he doesn't want DBS.  He was dx with anterior uveitis since last visit and a f/u visit yesterday. He was told that he had swelling of the retina.   He is being referred to a specialist for it and wants to wait until that gets cleared up before he takes new medication.  He has trouble putting the eye drops in because of the tremor.  He remains on primidone 250 mg bid and is still drinking miller light 3-5 cans per day but states that some days he doesn't drink at all.    03/28/15 update:  Pt returns for follow up.  He was last seen a year ago.  He is on primidone 250 mg bid.  We discussed trying gabapentin and he was to call me back about this but never did; would like to try now.  States that he had another episode of uveitis in the spring time.  He still has significant tremor, left more than right.  States that he has cut  back on his zoloft from 50 mg to 25 mg as was told that this could contribute to tremor.    Current/Previously tried tremor medications: Propranolol, unknown dose; primidone; no Topamax due to history of nephrolithiasis x2  Current medications that may exacerbate tremor:  None  Outside reports reviewed: historical medical records, lab reports and referral letter/letters.  Allergies  Allergen Reactions  . Augmentin [Amoxicillin-Pot Clavulanate]   . Tetracyclines & Related     Current Outpatient Prescriptions on File Prior to Visit  Medication Sig Dispense Refill  . aspirin 81 MG tablet Take 81 mg by mouth daily.      . Multiple Vitamin (MULTIVITAMIN) tablet Take 1 tablet by mouth daily.      . primidone (MYSOLINE) 250 MG tablet TAKE 1 TABLET BY MOUTH TWICE DAILY 60 tablet 0  . sertraline (ZOLOFT) 50 MG tablet Take 25 mg by mouth daily.     . simvastatin (ZOCOR) 20 MG tablet Take 20 mg by mouth at bedtime.     . Tetrahydrozoline HCl (EYE DROPS OP) Apply to eye.     No current facility-administered medications on file prior to visit.    Past Medical History  Diagnosis Date  . Angina   . Kidney  stones 1992  . Hypercholesterolemia   . Varicose veins of left lower extremity with pain   . DVT (deep venous thrombosis) 1990    LLE  . Heart disease   . Coronary artery disease   . BPH (benign prostatic hyperplasia)   . Anxiety   . Essential tremor   . RBBB (right bundle branch block with left anterior fascicular block)   . Diverticulosis   . Uveitis     Past Surgical History  Procedure Laterality Date  . Cardiac catheterization  1997  . Coronary angioplasty  1997  . Appendectomy  1951  . Umbilical hernia repair    . Inguinal hernia repair Bilateral   . Lithotripsy    . Cataract extraction Right 08/2013    Social History   Social History  . Marital Status: Widowed    Spouse Name: N/A  . Number of Children: N/A  . Years of Education: N/A   Occupational History  .  retired    Social History Main Topics  . Smoking status: Former Smoker -- 2.00 packs/day for 35 years    Types: Cigarettes    Quit date: 06/29/1991  . Smokeless tobacco: Never Used  . Alcohol Use: Yes     Comment: 2-3 beers daily  . Drug Use: No  . Sexual Activity: Not on file   Other Topics Concern  . Not on file   Social History Narrative   The patient is a widower with 1 year of college.His wife had a hemorrhagic stroke, but did well for 3 years  and then had metastatic disease to her brain and  died 107/10.  He has 2 children. He is retired and lives at home. He used to smoke 2 packs of cigarettes a day but quit 07/09/1992.  He drinks 3-4 beers a day and 1 diet coke.    Family Status  Relation Status Death Age  . Mother Deceased     heart disease  . Father Deceased     heart disease, EtOHism  . Daughter Alive     healthy  . Son Alive     healthy  . Sister Alive     tremor, breast CA    Review of Systems A complete 10 system ROS was obtained and was negative apart from what is mentioned.   Objective:   VITALS:   Filed Vitals:   03/28/15 1441  BP: 130/90  Pulse: 70  Height: 6' (1.829 m)  Weight: 196 lb (88.905 kg)   Gen:  Appears stated age and in NAD. HEENT:  Normocephalic, atraumatic. The mucous membranes are moist. The superficial temporal arteries are without ropiness or tenderness. Cardiovascular: Regular rate and rhythm. Lungs: Clear to auscultation bilaterally. Neck: There are no carotid bruits noted bilaterally.  NEUROLOGICAL:  Orientation:  The patient is alert and oriented x 3.  Recent and remote memory are intact.   Cranial nerves: There is good facial symmetry. The pupils are equal round and reactive to light bilaterally. Fundoscopic exam reveals clear disc margins bilaterally. Extraocular muscles are intact and visual fields are full to confrontational testing. Speech is fluent and clear. Soft palate rises symmetrically and there is no tongue  deviation. Hearing is intact to conversational tone. Tone: Tone is good throughout. Sensation: Sensation is intact to light touch throughout. Coordination:  The patient has no dysdiadichokinesia or dysmetria. Motor: Strength is 5/5 in the bilateral upper and lower extremities.  Shoulder shrug is equal bilaterally.  There is no pronator drift.  There are no fasciculations noted. Gait and Station: The patient is able to ambulate without difficulty.   MOVEMENT EXAM: Tremor:  There is tremor in the UE, noted most significantly with action.  The patients tremor is moderate to severe.  He has tremor in the arms and legs and the L is worse than the right.      Assessment/Plan:   1.  Essential Tremor, moderately severe.  -This is evidenced by the symmetrical nature and longstanding hx of gradually getting worse.  I think it is unlikely that we will get significant control over tremor without DBS surgery, and we talked about this again today.  He has researched this and thought about it, and really is not interested in DBS surgery.  He wants to try gabapentin.  I did tell him that I didn't think that this would make a huge difference but he wants to try and I think that is reasonable    I asked him just to call me back and let me how he is doing.  I did tell him, however, that he should not expect to get complete control over tremor with this medication and, at best, the hope would be to dampen down tremor somewhat.   2.  peripheral neuropathy on examination  - It is recommended again that EtOH be discontinued or tapered significantly.   3.  F/u 6-8 months, sooner should new neuro issues arise.  Much greater than 50% of this visit was spent in counseling with the patient and the family.  Total face to face time:  25 min.  In the meantime, I will try to get a copy of labs from his PCP

## 2015-03-28 NOTE — Patient Instructions (Signed)
1.  Start gabapentin - 300 mg at night for a week and then you may take 300 mg twice a day.  If you are not having side effects and IF you still have not noticed a benefit for tremor you can increase to 2 tablets (600mg ) in the AM and take 300 mg at night 2.  I have sent a RX for your refill of primidone to the pharmacy

## 2015-04-27 ENCOUNTER — Telehealth: Payer: Self-pay | Admitting: Neurology

## 2015-04-27 NOTE — Telephone Encounter (Signed)
PT called and said he is not doing well on his gabapentin and he wants to come off it/Dawn CB# [phone] to leave a message/Dawn

## 2015-04-27 NOTE — Telephone Encounter (Signed)
Left message on machine for patient to call back. To let us know what kind of symptoms he is having and if he wants to try a different medication. Awaiting call back.

## 2015-04-27 NOTE — Telephone Encounter (Signed)
Patient states since starting Gabapentin his tremor is worse and he is having dizziness and trouble focusing. He is going to stop medication and is not interested in starting anything else. I talked with him about Lyrica and he will call back if he is interested in the future.

## 2015-06-02 ENCOUNTER — Other Ambulatory Visit: Payer: Self-pay | Admitting: Neurology

## 2015-06-05 NOTE — Telephone Encounter (Signed)
Gabapentin refill denied. Last note states patient stopped medication because it made symptoms worse.

## 2015-07-04 DIAGNOSIS — S83232D Complex tear of medial meniscus, current injury, left knee, subsequent encounter: Secondary | ICD-10-CM | POA: Diagnosis not present

## 2015-07-04 DIAGNOSIS — S83282D Other tear of lateral meniscus, current injury, left knee, subsequent encounter: Secondary | ICD-10-CM | POA: Diagnosis not present

## 2015-07-04 DIAGNOSIS — M1712 Unilateral primary osteoarthritis, left knee: Secondary | ICD-10-CM | POA: Diagnosis not present

## 2015-07-11 DIAGNOSIS — Z01818 Encounter for other preprocedural examination: Secondary | ICD-10-CM | POA: Diagnosis not present

## 2015-07-12 DIAGNOSIS — S83232D Complex tear of medial meniscus, current injury, left knee, subsequent encounter: Secondary | ICD-10-CM | POA: Diagnosis not present

## 2015-07-12 DIAGNOSIS — M1712 Unilateral primary osteoarthritis, left knee: Secondary | ICD-10-CM | POA: Diagnosis not present

## 2015-07-12 DIAGNOSIS — S83282D Other tear of lateral meniscus, current injury, left knee, subsequent encounter: Secondary | ICD-10-CM | POA: Diagnosis not present

## 2015-07-20 DIAGNOSIS — M94262 Chondromalacia, left knee: Secondary | ICD-10-CM | POA: Diagnosis not present

## 2015-07-20 DIAGNOSIS — G8918 Other acute postprocedural pain: Secondary | ICD-10-CM | POA: Diagnosis not present

## 2015-07-20 DIAGNOSIS — M1712 Unilateral primary osteoarthritis, left knee: Secondary | ICD-10-CM | POA: Diagnosis not present

## 2015-07-20 DIAGNOSIS — M23322 Other meniscus derangements, posterior horn of medial meniscus, left knee: Secondary | ICD-10-CM | POA: Diagnosis not present

## 2015-07-20 DIAGNOSIS — Y939 Activity, unspecified: Secondary | ICD-10-CM | POA: Diagnosis not present

## 2015-07-20 DIAGNOSIS — Y999 Unspecified external cause status: Secondary | ICD-10-CM | POA: Diagnosis not present

## 2015-07-20 DIAGNOSIS — S83232A Complex tear of medial meniscus, current injury, left knee, initial encounter: Secondary | ICD-10-CM | POA: Diagnosis not present

## 2015-07-20 DIAGNOSIS — Y929 Unspecified place or not applicable: Secondary | ICD-10-CM | POA: Diagnosis not present

## 2015-07-28 DIAGNOSIS — M25562 Pain in left knee: Secondary | ICD-10-CM | POA: Diagnosis not present

## 2015-08-01 DIAGNOSIS — M25562 Pain in left knee: Secondary | ICD-10-CM | POA: Diagnosis not present

## 2015-08-03 DIAGNOSIS — M25562 Pain in left knee: Secondary | ICD-10-CM | POA: Diagnosis not present

## 2015-08-08 DIAGNOSIS — M25562 Pain in left knee: Secondary | ICD-10-CM | POA: Diagnosis not present

## 2015-08-10 DIAGNOSIS — M25562 Pain in left knee: Secondary | ICD-10-CM | POA: Diagnosis not present

## 2015-08-15 ENCOUNTER — Other Ambulatory Visit: Payer: Self-pay | Admitting: Neurology

## 2015-08-15 DIAGNOSIS — M25562 Pain in left knee: Secondary | ICD-10-CM | POA: Diagnosis not present

## 2015-08-15 NOTE — Telephone Encounter (Signed)
Rx sent 

## 2015-08-16 ENCOUNTER — Telehealth: Payer: Self-pay | Admitting: Neurology

## 2015-08-16 NOTE — Telephone Encounter (Signed)
Spoke with [facility] - they state patient says he should be on Primidone 250 mg 2 tablets twice daily. All of our records for the past year and [facility] records show 1 tablet BID. Patient will be instructed to call us with any questions.

## 2015-08-16 NOTE — Telephone Encounter (Signed)
[facility] called in regards to PT's medication/Dawn CB# [phone]

## 2015-08-22 DIAGNOSIS — M25562 Pain in left knee: Secondary | ICD-10-CM | POA: Diagnosis not present

## 2015-08-23 DIAGNOSIS — E7800 Pure hypercholesterolemia, unspecified: Secondary | ICD-10-CM | POA: Diagnosis not present

## 2015-08-23 DIAGNOSIS — Z125 Encounter for screening for malignant neoplasm of prostate: Secondary | ICD-10-CM | POA: Diagnosis not present

## 2015-08-23 DIAGNOSIS — Z79899 Other long term (current) drug therapy: Secondary | ICD-10-CM | POA: Diagnosis not present

## 2015-08-25 DIAGNOSIS — M25562 Pain in left knee: Secondary | ICD-10-CM | POA: Diagnosis not present

## 2015-08-28 DIAGNOSIS — M25562 Pain in left knee: Secondary | ICD-10-CM | POA: Diagnosis not present

## 2015-08-28 DIAGNOSIS — Z0000 Encounter for general adult medical examination without abnormal findings: Secondary | ICD-10-CM | POA: Diagnosis not present

## 2015-08-30 DIAGNOSIS — I2510 Atherosclerotic heart disease of native coronary artery without angina pectoris: Secondary | ICD-10-CM | POA: Diagnosis not present

## 2015-08-30 DIAGNOSIS — M25562 Pain in left knee: Secondary | ICD-10-CM | POA: Diagnosis not present

## 2015-08-30 DIAGNOSIS — I8390 Asymptomatic varicose veins of unspecified lower extremity: Secondary | ICD-10-CM | POA: Diagnosis not present

## 2015-08-30 DIAGNOSIS — E785 Hyperlipidemia, unspecified: Secondary | ICD-10-CM | POA: Diagnosis not present

## 2015-08-30 DIAGNOSIS — N400 Enlarged prostate without lower urinary tract symptoms: Secondary | ICD-10-CM | POA: Diagnosis not present

## 2015-09-05 DIAGNOSIS — M25562 Pain in left knee: Secondary | ICD-10-CM | POA: Diagnosis not present

## 2015-09-07 DIAGNOSIS — M25562 Pain in left knee: Secondary | ICD-10-CM | POA: Diagnosis not present

## 2015-09-11 DIAGNOSIS — M25562 Pain in left knee: Secondary | ICD-10-CM | POA: Diagnosis not present

## 2015-09-13 DIAGNOSIS — M25562 Pain in left knee: Secondary | ICD-10-CM | POA: Diagnosis not present

## 2015-09-14 ENCOUNTER — Other Ambulatory Visit: Payer: Self-pay | Admitting: Neurology

## 2015-09-14 NOTE — Telephone Encounter (Signed)
Primidone 250 mg refill requested. Per last office note- patient to remain on medication. Refill approved and sent to patient's pharmacy.

## 2015-09-26 DIAGNOSIS — M25562 Pain in left knee: Secondary | ICD-10-CM | POA: Diagnosis not present

## 2015-10-23 DIAGNOSIS — M1712 Unilateral primary osteoarthritis, left knee: Secondary | ICD-10-CM | POA: Diagnosis not present

## 2015-10-23 DIAGNOSIS — G5782 Other specified mononeuropathies of left lower limb: Secondary | ICD-10-CM | POA: Diagnosis not present

## 2015-10-30 DIAGNOSIS — M1712 Unilateral primary osteoarthritis, left knee: Secondary | ICD-10-CM | POA: Diagnosis not present

## 2015-11-02 DIAGNOSIS — H2013 Chronic iridocyclitis, bilateral: Secondary | ICD-10-CM | POA: Diagnosis not present

## 2015-11-02 DIAGNOSIS — H35353 Cystoid macular degeneration, bilateral: Secondary | ICD-10-CM | POA: Diagnosis not present

## 2015-11-06 DIAGNOSIS — M1712 Unilateral primary osteoarthritis, left knee: Secondary | ICD-10-CM | POA: Diagnosis not present

## 2015-12-05 DIAGNOSIS — L570 Actinic keratosis: Secondary | ICD-10-CM | POA: Diagnosis not present

## 2015-12-05 DIAGNOSIS — L821 Other seborrheic keratosis: Secondary | ICD-10-CM | POA: Diagnosis not present

## 2015-12-05 DIAGNOSIS — D239 Other benign neoplasm of skin, unspecified: Secondary | ICD-10-CM | POA: Diagnosis not present

## 2015-12-05 DIAGNOSIS — D485 Neoplasm of uncertain behavior of skin: Secondary | ICD-10-CM | POA: Diagnosis not present

## 2015-12-05 DIAGNOSIS — C44319 Basal cell carcinoma of skin of other parts of face: Secondary | ICD-10-CM | POA: Diagnosis not present

## 2015-12-05 DIAGNOSIS — L820 Inflamed seborrheic keratosis: Secondary | ICD-10-CM | POA: Diagnosis not present

## 2015-12-05 DIAGNOSIS — C4491 Basal cell carcinoma of skin, unspecified: Secondary | ICD-10-CM

## 2015-12-05 HISTORY — DX: Basal cell carcinoma of skin, unspecified: C44.91

## 2015-12-07 DIAGNOSIS — M792 Neuralgia and neuritis, unspecified: Secondary | ICD-10-CM | POA: Diagnosis not present

## 2015-12-18 DIAGNOSIS — M1712 Unilateral primary osteoarthritis, left knee: Secondary | ICD-10-CM | POA: Diagnosis not present

## 2015-12-18 DIAGNOSIS — M25562 Pain in left knee: Secondary | ICD-10-CM | POA: Diagnosis not present

## 2016-01-02 DIAGNOSIS — R7303 Prediabetes: Secondary | ICD-10-CM | POA: Diagnosis not present

## 2016-01-02 DIAGNOSIS — Z79899 Other long term (current) drug therapy: Secondary | ICD-10-CM | POA: Diagnosis not present

## 2016-01-04 DIAGNOSIS — F419 Anxiety disorder, unspecified: Secondary | ICD-10-CM | POA: Diagnosis not present

## 2016-01-04 DIAGNOSIS — R7303 Prediabetes: Secondary | ICD-10-CM | POA: Diagnosis not present

## 2016-01-05 DIAGNOSIS — H25042 Posterior subcapsular polar age-related cataract, left eye: Secondary | ICD-10-CM | POA: Diagnosis not present

## 2016-01-05 DIAGNOSIS — Z961 Presence of intraocular lens: Secondary | ICD-10-CM | POA: Diagnosis not present

## 2016-01-05 DIAGNOSIS — H2512 Age-related nuclear cataract, left eye: Secondary | ICD-10-CM | POA: Diagnosis not present

## 2016-01-16 ENCOUNTER — Other Ambulatory Visit: Payer: Self-pay | Admitting: Neurology

## 2016-01-16 MED ORDER — PRIMIDONE 250 MG PO TABS: 250.0000 mg | ORAL_TABLET | Freq: Two times a day (BID) | ORAL | 0 refills | Status: DC

## 2016-01-16 NOTE — Telephone Encounter (Signed)
Primidone refill requested. Per last office note- patient to remain on medication. Refill approved for one month with note that patient needs appt for further refills and sent to patient's pharmacy.

## 2016-01-25 DIAGNOSIS — Z8669 Personal history of other diseases of the nervous system and sense organs: Secondary | ICD-10-CM | POA: Diagnosis not present

## 2016-01-25 DIAGNOSIS — H25042 Posterior subcapsular polar age-related cataract, left eye: Secondary | ICD-10-CM | POA: Diagnosis not present

## 2016-01-25 DIAGNOSIS — H2512 Age-related nuclear cataract, left eye: Secondary | ICD-10-CM | POA: Diagnosis not present

## 2016-01-25 DIAGNOSIS — H25812 Combined forms of age-related cataract, left eye: Secondary | ICD-10-CM | POA: Diagnosis not present

## 2016-01-29 HISTORY — PX: NOSE SURGERY: SHX723

## 2016-02-02 DIAGNOSIS — C4401 Basal cell carcinoma of skin of lip: Secondary | ICD-10-CM | POA: Diagnosis not present

## 2016-02-06 NOTE — Progress Notes (Signed)
;Subjective:    Joseph Hood was seen in consultation in the movement disorder clinic at the request of [name], MD.  The evaluation is for tremor.  The patient has previously seen Dr. [name] as well as Dr. [name].  The patient was last seen by Dr. [name] in July, 2014.  I had the opportunity to review those notes.  The pt is R hand dominant.  Pt states that his father and paternal grandfather had tremor as well and his sister has tremor.  He first noted tremor in his 80's.  The L hand shakes worse than the R.  In the past, the patient has tried propranolol, but that did not seem to help.  I do not know the dose that the patient tried.  The patient did not think that primidone, 250 mg twice a day helped, but according to notes it was attempted to discontinue the medication and tremor got worse and the medication was reinitiated.  Dr. [name] did talk to the patient about DBS therapy and offered consults at [facility].  The patient does have a history of nephrolithiasis.  03/29/14 update:  Pt returns today for follow up.  States that he has thought about his options and he doesn't want DBS.  He was dx with anterior uveitis since last visit and a f/u visit yesterday. He was told that he had swelling of the retina.   He is being referred to a specialist for it and wants to wait until that gets cleared up before he takes new medication.  He has trouble putting the eye drops in because of the tremor.  He remains on primidone 250 mg bid and is still drinking miller light 3-5 cans per day but states that some days he doesn't drink at all.    03/28/15 update:  Pt returns for follow up.  He was last seen a year ago.  He is on primidone 250 mg bid.  We discussed trying gabapentin and he was to call me back about this but never did; would like to try now.  States that he had another episode of uveitis in the spring time.  He still has significant tremor, left more than right.  States that he has cut  back on his zoloft from 50 mg to 25 mg as was told that this could contribute to tremor.   02/07/16 update:  The patient returns today for follow-up, still on primidone, 250 mg twice a day. States that if he drinks wine, the tremor virtually stops.   Last visit, he was interested in starting low-dose gabapentin, 300 mg twice a day.  He called me back not long thereafter and stated that it caused worsening of tremor, dizziness and cognitive change.  He wanted to go off the medication.  He did not want to try another medication such as Lyrica.  The pharmacy did call at one point and stated that the patient had told them he was on 500 mg of primidone twice a day, but that was never in pharmacy records, nor was it in our records.  Pt states he is going to have R TKA in October.  2 weeks ago he had cataract surgery on the right.  Last week, he had a basal cell removed on the face.  He still has stitches.  Current/Previously tried tremor medications: Propranolol, unknown dose; primidone; no Topamax due to history of nephrolithiasis x2; gabapentin - 300 mg twice a day caused dizziness  Current medications that  may exacerbate tremor:  None  Outside reports reviewed: historical medical records, lab reports and referral letter/letters.  Allergies  Allergen Reactions  . Augmentin [Amoxicillin-Pot Clavulanate]   . Tetracyclines & Related     Current Outpatient Prescriptions on File Prior to Visit  Medication Sig Dispense Refill  . aspirin 81 MG tablet Take 81 mg by mouth daily.      . Multiple Vitamin (MULTIVITAMIN) tablet Take 1 tablet by mouth daily.      . primidone (MYSOLINE) 250 MG tablet Take 1 tablet (250 mg total) by mouth 2 (two) times daily. 60 tablet 0  . sertraline (ZOLOFT) 50 MG tablet Take 25 mg by mouth daily.     . simvastatin (ZOCOR) 20 MG tablet Take 20 mg by mouth at bedtime.      No current facility-administered medications on file prior to visit.     Past Medical History:    Diagnosis Date  . Angina   . Anxiety   . BPH (benign prostatic hyperplasia)   . Coronary artery disease   . Diverticulosis   . DVT (deep venous thrombosis) (HCC) 1990   LLE  . Essential tremor   . Heart disease   . Hypercholesterolemia   . Kidney stones 1992  . RBBB (right bundle branch block with left anterior fascicular block)   . Uveitis   . Varicose veins of left lower extremity with pain     Past Surgical History:  Procedure Laterality Date  . APPENDECTOMY  1951  . CARDIAC CATHETERIZATION  1997  . CATARACT EXTRACTION Right 08/2013  . CORONARY ANGIOPLASTY  1997  . INGUINAL HERNIA REPAIR Bilateral   . LITHOTRIPSY    . NOSE SURGERY  01/29/2016  . UMBILICAL HERNIA REPAIR      Social History   Social History  . Marital status: Widowed    Spouse name: N/A  . Number of children: N/A  . Years of education: N/A   Occupational History  . retired Retired   Social History Main Topics  . Smoking status: Former Smoker    Packs/day: 2.00    Years: 35.00    Types: Cigarettes    Quit date: 06/29/1991  . Smokeless tobacco: Never Used  . Alcohol use Yes     Comment: 2-3 beers daily  . Drug use: No  . Sexual activity: Not on file   Other Topics Concern  . Not on file   Social History Narrative   The patient is a widower with 1 year of college.His wife had a hemorrhagic stroke, but did well for 3 years  and then had metastatic disease to her brain and  died 60/10.  He has 2 children. He is retired and lives at home. He used to smoke 2 packs of cigarettes a day but quit 07/09/1992.  He drinks 3-4 beers a day and 1 diet coke.    Family Status  Relation Status  . Mother Deceased   heart disease  . Father Deceased   heart disease, EtOHism  . Daughter Alive   healthy  . Son Alive   healthy  . Sister Alive   tremor, breast CA    Review of Systems A complete 10 system ROS was obtained and was negative apart from what is mentioned.   Objective:   VITALS:   Vitals:    02/07/16 1037  BP: 138/80  BP Location: Right Arm  Patient Position: Sitting  Cuff Size: Normal  Pulse: 70  Weight: 188 lb (85.3 kg)  Height: 6' (1.829 m)   Gen:  Appears stated age and in NAD. HEENT:  Normocephalic.  He has healing wounds on the face due to basal cell on the right.   The mucous membranes are moist. The superficial temporal arteries are without ropiness or tenderness. Cardiovascular: Regular rate and rhythm. Lungs: Clear to auscultation bilaterally. Neck: There are no carotid bruits noted bilaterally.  NEUROLOGICAL:  Orientation:  The patient is alert and oriented x 3.  Recent and remote memory are intact.   Cranial nerves: There is good facial symmetry. Extraocular muscles are intact and visual fields are full to confrontational testing. Speech is fluent and clear. Soft palate rises symmetrically and there is no tongue deviation. Hearing is intact to conversational tone. Tone: Tone is good throughout. Sensation: Sensation is intact to light touch throughout. Coordination:  The patient has no dysdiadichokinesia or dysmetria. Motor: Strength is 5/5 in the bilateral upper and lower extremities.  Shoulder shrug is equal bilaterally.  There is no pronator drift.  There are no fasciculations noted. Gait and Station: The patient is able to ambulate without difficulty.   MOVEMENT EXAM: Tremor:  There is tremor in the UE, noted most significantly with action.   The patients tremor is moderate to severe.  He has tremor in the arms and legs and the L is worse than the right.   It is not worse with a weight in the arm.    LABS:  Last lab work was able to obtain from his primary care physician was dated 10/02/2014.  At that point his white blood cells were 6.0, hemoglobin 15.2, hematocrit 45.4 and platelets 164.  His BMP was normal with a BUN of 11 and creatinine of 1.1.  His AST was 21, ALT 38 and alkaline phosphatase 66.     Assessment/Plan:   1.  Essential Tremor,  moderately severe.  -This is evidenced by the symmetrical nature and longstanding hx of gradually getting worse.  I think it is unlikely that we will get significant control over tremor without DBS surgery, and we talked about this again today.  He has researched this and thought about it, and really is not interested in DBS surgery.  I would be nervous about a purely anticholinergic medication in his age group (Artane, Cogentin). 2.  peripheral neuropathy on examination  - It is recommended again that EtOH be discontinued or tapered significantly.   3.  F/u 6-8 months, sooner should new neuro issues arise.  Much greater than 50% of this visit was spent in counseling with the patient and the family.  Total face to face time:  25 min.

## 2016-02-07 ENCOUNTER — Ambulatory Visit (INDEPENDENT_AMBULATORY_CARE_PROVIDER_SITE_OTHER): Payer: PPO | Admitting: Neurology

## 2016-02-07 ENCOUNTER — Encounter: Payer: Self-pay | Admitting: Neurology

## 2016-02-07 VITALS — BP 138/80 | HR 70 | Ht 72.0 in | Wt 188.0 lb

## 2016-02-07 DIAGNOSIS — G250 Essential tremor: Secondary | ICD-10-CM

## 2016-02-07 MED ORDER — ABDOMINAL BINDER/ELASTIC LARGE MISC: 1.0000 | Freq: Every day | 0 refills | Status: DC

## 2016-02-07 MED ORDER — PRIMIDONE 250 MG PO TABS: 250.0000 mg | ORAL_TABLET | Freq: Two times a day (BID) | ORAL | 2 refills | Status: DC

## 2016-02-27 ENCOUNTER — Ambulatory Visit (INDEPENDENT_AMBULATORY_CARE_PROVIDER_SITE_OTHER): Payer: PPO | Admitting: Cardiovascular Disease

## 2016-02-27 ENCOUNTER — Telehealth: Payer: Self-pay | Admitting: Cardiovascular Disease

## 2016-02-27 ENCOUNTER — Encounter: Payer: Self-pay | Admitting: Cardiovascular Disease

## 2016-02-27 VITALS — BP 140/94 | HR 70 | Ht 72.0 in | Wt 192.0 lb

## 2016-02-27 DIAGNOSIS — E785 Hyperlipidemia, unspecified: Secondary | ICD-10-CM | POA: Insufficient documentation

## 2016-02-27 DIAGNOSIS — Z01818 Encounter for other preprocedural examination: Secondary | ICD-10-CM

## 2016-02-27 DIAGNOSIS — I2510 Atherosclerotic heart disease of native coronary artery without angina pectoris: Secondary | ICD-10-CM | POA: Insufficient documentation

## 2016-02-27 DIAGNOSIS — Z01810 Encounter for preprocedural cardiovascular examination: Secondary | ICD-10-CM | POA: Diagnosis not present

## 2016-02-27 NOTE — Assessment & Plan Note (Signed)
History of hyperlipidemia on statin therapy followed by his PCP 

## 2016-02-27 NOTE — Patient Instructions (Signed)
Medication Instructions:  Your physician recommends that you continue on your current medications as directed. Please refer to the Current Medication list given to you today.   Testing/Procedures: Your physician has requested that you have a lexiscan myoview. For further information please visit [email or web address]. Please follow instruction sheet, as given.    Follow-Up: Your physician recommends that you schedule a follow-up appointment ON AN AS NEEDED BASIS.   Any Other Special Instructions Will Be Listed Below (If Applicable). Pharmacologic Stress Electrocardiogram A pharmacologic stress electrocardiogram is a heart (cardiac) test that uses nuclear imaging to evaluate the blood supply to your heart. This test may also be called a pharmacologic stress electrocardiography. Pharmacologic means that a medicine is used to increase your heart rate and blood pressure.  This stress test is done to find areas of poor blood flow to the heart by determining the extent of coronary artery disease (CAD). Some people exercise on a treadmill, which naturally increases the blood flow to the heart. For those people unable to exercise on a treadmill, a medicine is used. This medicine stimulates your heart and will cause your heart to beat harder and more quickly, as if you were exercising.  Pharmacologic stress tests can help determine:  The adequacy of blood flow to your heart during increased levels of activity in order to clear you for discharge home.  The extent of coronary artery blockage caused by CAD.  Your prognosis if you have suffered a heart attack.  The effectiveness of cardiac procedures done, such as an angioplasty, which can increase the circulation in your coronary arteries.  Causes of chest pain or pressure. LET [facility] CARE PROVIDER KNOW ABOUT:  Any allergies you have.  All medicines you are taking, including vitamins, herbs, eye drops, creams, and over-the-counter  medicines.  Previous problems you or members of your family have had with the use of anesthetics.  Any blood disorders you have.  Previous surgeries you have had.  Medical conditions you have.  Possibility of pregnancy, if this applies.  If you are currently breastfeeding. RISKS AND COMPLICATIONS Generally, this is a safe procedure. However, as with any procedure, complications can occur. Possible complications include:  You develop pain or pressure in the following areas:  Chest.  Jaw or neck.  Between your shoulder blades.  Radiating down your left arm.  Headache.  Dizziness or light-headedness.  Shortness of breath.  Increased or irregular heartbeat.  Low blood pressure.  Nausea or vomiting.  Flushing.  Redness going up the arm and slight pain during injection of medicine.  Heart attack (rare). BEFORE THE PROCEDURE   Avoid all forms of caffeine for 24 hours before your test or as directed by your health care provider. This includes coffee, tea (even decaffeinated tea), caffeinated sodas, chocolate, cocoa, and certain pain medicines.  Follow your health care provider's instructions regarding eating and drinking before the test.  Take your medicines as directed at regular times with water unless instructed otherwise. Exceptions may include:  If you have diabetes, ask how you are to take your insulin or pills. It is common to adjust insulin dosing the morning of the test.  If you are taking beta-blocker medicines, it is important to talk to your health care provider about these medicines well before the date of your test. Taking beta-blocker medicines may interfere with the test. In some cases, these medicines need to be changed or stopped 24 hours or more before the test.  If you wear a nitroglycerin patch,  it may need to be removed prior to the test. Ask your health care provider if the patch should be removed before the test.  If you use an inhaler for any  breathing condition, bring it with you to the test.  If you are an outpatient, bring a snack so you can eat right after the stress phase of the test.  Do not smoke for 4 hours prior to the test or as directed by your health care provider.  Do not apply lotions, powders, creams, or oils on your chest prior to the test.  Wear comfortable shoes and clothing. Let your health care provider know if you were unable to complete or follow the preparations for your test. PROCEDURE   Multiple patches (electrodes) will be put on your chest. If needed, small areas of your chest may be shaved to get better contact with the electrodes. Once the electrodes are attached to your body, multiple wires will be attached to the electrodes, and your heart rate will be monitored.  An IV access will be started. A nuclear trace (isotope) is given. The isotope may be given intravenously, or it may be swallowed. Nuclear refers to several types of radioactive isotopes, and the nuclear isotope lights up the arteries so that the nuclear images are clear. The isotope is absorbed by your body. This results in low radiation exposure.  A resting nuclear image is taken to show how your heart functions at rest.  A medicine is given through the IV access.  A second scan is done about 1 hour after the medicine injection and determines how your heart functions under stress.  During this stress phase, you will be connected to an electrocardiogram machine. Your blood pressure and oxygen levels will be monitored. AFTER THE PROCEDURE   Your heart rate and blood pressure will be monitored after the test.  You may return to your normal schedule, including diet,activities, and medicines, unless your health care provider tells you otherwise.   This information is not intended to replace advice given to you by your health care provider. Make sure you discuss any questions you have with your health care provider.   Document Released:  10/27/2008 Document Revised: 06/15/2013 Document Reviewed: 02/15/2013 Elsevier Interactive Patient Education [employer].     If you need a refill on your cardiac medications before your next appointment, please call your pharmacy.

## 2016-02-27 NOTE — Assessment & Plan Note (Signed)
History of CAD status post stenting back in 1995 by Dr. Roe Rutherford. He is asymptomatic at the current time. He is scheduled for total knee replacement. I'm going to order a functional study to risk stratify him

## 2016-02-27 NOTE — Progress Notes (Signed)
02/27/2016 Joseph Hood   Apr 06, 1941  PH:[id number]  Primary Physician [name], MD Primary Cardiologist: Lorretta Harp MD Renae Gloss  HPI:  Joseph Hood is a delightful 75 year old widowed Caucasian male father to, grandfather 4 grandchildren referred for preoperative clearance before left total knee replacement. His wife of 21 years passed away 7 years ago. His cardiac risk factor profile is remarkable for remote tobacco abuse having quit 20 years ago as well as hyperlipidemia. His mother did die of a myocardial infarction. He had stenting back in 1995 by Dr. Glade Lloyd and stress testing approximately 10 years ago by Dr. wall. He does have a benign essential tremor. He is retired from working at [employer]. He denies chest pain or shortness of breath. He apparently needs elective left total knee replacement by Dr. Theda Sers in the upcoming future and was referred for cardiac clearance.   Current Outpatient Prescriptions  Medication Sig Dispense Refill  . aspirin 81 MG tablet Take 81 mg by mouth daily.      . Multiple Vitamin (MULTIVITAMIN) tablet Take 1 tablet by mouth daily.      . primidone (MYSOLINE) 250 MG tablet Take 1 tablet (250 mg total) by mouth 2 (two) times daily. 180 tablet 2  . sertraline (ZOLOFT) 50 MG tablet Take 25 mg by mouth daily.     . simvastatin (ZOCOR) 20 MG tablet Take 20 mg by mouth at bedtime.      No current facility-administered medications for this visit.     Allergies  Allergen Reactions  . Augmentin [Amoxicillin-Pot Clavulanate]   . Tetracyclines & Related     Social History   Social History  . Marital status: Widowed    Spouse name: N/A  . Number of children: N/A  . Years of education: N/A   Occupational History  . retired Retired   Social History Main Topics  . Smoking status: Former Smoker    Packs/day: 2.00    Years: 35.00    Types: Cigarettes    Quit date: 06/29/1991  . Smokeless tobacco: Never  Used  . Alcohol use Yes     Comment: 2-3 beers daily  . Drug use: No  . Sexual activity: Not on file   Other Topics Concern  . Not on file   Social History Narrative   The patient is a widower with 1 year of college.His wife had a hemorrhagic stroke, but did well for 3 years  and then had metastatic disease to her brain and  died 36/10.  He has 2 children. He is retired and lives at home. He used to smoke 2 packs of cigarettes a day but quit 07/09/1992.  He drinks 3-4 beers a day and 1 diet coke.     Review of Systems: General: negative for chills, fever, night sweats or weight changes.  Cardiovascular: negative for chest pain, dyspnea on exertion, edema, orthopnea, palpitations, paroxysmal nocturnal dyspnea or shortness of breath Dermatological: negative for rash Respiratory: negative for cough or wheezing Urologic: negative for hematuria Abdominal: negative for nausea, vomiting, diarrhea, bright red blood per rectum, melena, or hematemesis Neurologic: negative for visual changes, syncope, or dizziness All other systems reviewed and are otherwise negative except as noted above.    Blood pressure (!) 140/94, pulse 70, height 6' (1.829 m), weight 192 lb (87.1 kg).  General appearance: alert and no distress Neck: no adenopathy, no carotid bruit, no JVD, supple, symmetrical, trachea midline and thyroid not enlarged, symmetric, no  tenderness/mass/nodules Lungs: clear to auscultation bilaterally Heart: regular rate and rhythm, S1, S2 normal, no murmur, click, rub or gallop Extremities: extremities normal, atraumatic, no cyanosis or edema  EKG normal sinus rhythm at 70 with right bundle branch block. I personally reviewed this EKG  ASSESSMENT AND PLAN:   Hyperlipidemia History of hyperlipidemia on statin therapy followed by his PCP  Coronary artery disease History of CAD status post stenting back in 1995 by Dr. Roe Rutherford. He is asymptomatic at the current time. He is scheduled  for total knee replacement. I'm going to order a functional study to risk stratify him      Lorretta Harp MD [facility], [facility] 02/27/2016 12:14 PM

## 2016-02-27 NOTE — Telephone Encounter (Signed)
Received records from [facility] for appointment on 02/27/16 with Dr Gwenlyn Found.  Records given to [facility] (medical records) for Dr Kennon Holter schedule on 02/27/16. lp

## 2016-03-04 DIAGNOSIS — Z961 Presence of intraocular lens: Secondary | ICD-10-CM | POA: Diagnosis not present

## 2016-03-05 ENCOUNTER — Telehealth (HOSPITAL_COMMUNITY): Payer: Self-pay

## 2016-03-05 NOTE — Telephone Encounter (Signed)
Encounter complete. 

## 2016-03-06 ENCOUNTER — Inpatient Hospital Stay (HOSPITAL_COMMUNITY): Admission: RE | Admit: 2016-03-06 | Payer: PPO | Source: Ambulatory Visit

## 2016-03-07 ENCOUNTER — Ambulatory Visit (HOSPITAL_COMMUNITY)
Admission: RE | Admit: 2016-03-07 | Discharge: 2016-03-07 | Disposition: A | Discharge status: Home / Self Care | Payer: PPO | Source: Ambulatory Visit | Attending: Cardiology | Admitting: Cardiology

## 2016-03-07 DIAGNOSIS — Z01818 Encounter for other preprocedural examination: Secondary | ICD-10-CM | POA: Diagnosis not present

## 2016-03-07 DIAGNOSIS — I2510 Atherosclerotic heart disease of native coronary artery without angina pectoris: Secondary | ICD-10-CM | POA: Diagnosis not present

## 2016-03-07 DIAGNOSIS — Z01810 Encounter for preprocedural cardiovascular examination: Secondary | ICD-10-CM

## 2016-03-07 DIAGNOSIS — Z955 Presence of coronary angioplasty implant and graft: Secondary | ICD-10-CM | POA: Diagnosis not present

## 2016-03-07 LAB — MYOCARDIAL PERFUSION IMAGING
CHL CUP NUCLEAR SSS: 6
CHL CUP RESTING HR STRESS: 65 {beats}/min
LVDIAVOL: 82 mL (ref 62–150)
LVSYSVOL: 34 mL
NUC STRESS TID: 1.04
Peak HR: 90 {beats}/min
SDS: 4
SRS: 4

## 2016-03-07 MED ORDER — REGADENOSON 0.4 MG/5ML IV SOLN
0.4000 mg | Freq: Once | INTRAVENOUS | Status: AC
  Administered 2016-03-07: 0.4 mg via INTRAVENOUS

## 2016-03-07 MED ORDER — TECHNETIUM TC 99M TETROFOSMIN IV KIT
31.8000 | PACK | Freq: Once | INTRAVENOUS | Status: AC | PRN
  Administered 2016-03-07: 31.8 via INTRAVENOUS
  Filled 2016-03-07: qty 32

## 2016-03-07 MED ORDER — TECHNETIUM TC 99M TETROFOSMIN IV KIT
10.8000 | PACK | Freq: Once | INTRAVENOUS | Status: AC | PRN
  Administered 2016-03-07: 11 via INTRAVENOUS
  Filled 2016-03-07: qty 11

## 2016-03-11 ENCOUNTER — Telehealth: Payer: Self-pay | Admitting: Cardiovascular Disease

## 2016-03-11 NOTE — Telephone Encounter (Signed)
Returned call to patient and gave him results of stress test: Notes Recorded by Lorretta Harp, MD on 03/08/2016 at 7:35 AM EDT Call and tell normal  Patient asked if he was cleared for his surgery.  Will forward to Dr Gwenlyn Found for final clearance advice.

## 2016-03-11 NOTE — Telephone Encounter (Signed)
Message routed to Dr Theda Sers office.

## 2016-03-11 NOTE — Telephone Encounter (Signed)
History of stress was low risk. He is cleared for his knee replacement at low risk. He can stop his aspirin for the procedure.

## 2016-03-11 NOTE — Progress Notes (Signed)
Pt is being scheduled for preop appt; please place surgical orders in epic. Thanks.  

## 2016-03-11 NOTE — Telephone Encounter (Signed)
New message   Pt verbalized that he is returning call for rn  He said he will be home tofay

## 2016-03-11 NOTE — Telephone Encounter (Signed)
Requesting surgical clearance:   1. Type of surgery: Left knee replacement  2. Surgeon: Dr Theda Sers  3. Surgical date: March 30, 2016  4. Medications that need to be held: aspirin  5. CAD: No     6. I will defer to: Dr [facility]. Fax- [phone]

## 2016-03-11 NOTE — H&P (Signed)
TOTAL KNEE ADMISSION H&P  Patient is being admitted for left total knee arthroplasty.  Subjective:  Chief Complaint:left knee pain.  HPI: Joseph Hood, 75 y.o. male, has a history of pain and functional disability in the left knee due to arthritis and has failed non-surgical conservative treatments for greater than 12 weeks to includecorticosteriod injections, viscosupplementation injections, flexibility and strengthening excercises, weight reduction as appropriate and activity modification.  Onset of symptoms was gradual, starting 4 years ago with gradually worsening course since that time. The patient noted prior procedures on the knee to include  arthroscopy on the left knee(s).  Patient currently rates pain in the left knee(s) at 7 out of 10 with activity. Patient has worsening of pain with activity and weight bearing, pain that interferes with activities of daily living, pain with passive range of motion and joint swelling.  Patient has evidence of subchondral cysts, subchondral sclerosis and joint space narrowing by imaging studies. This patient has had  There is no active infection.  Patient Active Problem List   Diagnosis Date Noted  . Hyperlipidemia 02/27/2016  . Coronary artery disease 02/27/2016  . Essential and other specified forms of tremor 12/21/2013  . DEPRESSION/ANXIETY 03/02/2009  . NOCTURIA 03/02/2009  . BENIGN PROSTATIC HYPERTROPHY, MILD, HX OF 03/02/2009  . DEEP VENOUS THROMBOPHLEBITIS, HX OF 02/25/2009   Past Medical History:  Diagnosis Date  . Angina   . Anxiety   . BPH (benign prostatic hyperplasia)   . Coronary artery disease   . Diverticulosis   . DVT (deep venous thrombosis) (HCC) 1990   LLE  . Essential tremor   . Heart disease   . Hypercholesterolemia   . Kidney stones 1992  . RBBB (right bundle branch block with left anterior fascicular block)   . Uveitis   . Varicose veins of left lower extremity with pain     Past Surgical History:   Procedure Laterality Date  . APPENDECTOMY  1951  . CARDIAC CATHETERIZATION  1997  . CATARACT EXTRACTION Right 08/2013  . CORONARY ANGIOPLASTY  1997  . INGUINAL HERNIA REPAIR Bilateral   . LITHOTRIPSY    . NOSE SURGERY  01/29/2016  . UMBILICAL HERNIA REPAIR      No prescriptions prior to admission.   Allergies  Allergen Reactions  . Augmentin [Amoxicillin-Pot Clavulanate]   . Tetracyclines & Related     Social History  Substance Use Topics  . Smoking status: Former Smoker    Packs/day: 2.00    Years: 35.00    Types: Cigarettes    Quit date: 06/29/1991  . Smokeless tobacco: Never Used  . Alcohol use Yes     Comment: 2-3 beers daily    No family history on file.   Review of Systems  Constitutional: Negative.   HENT: Negative.   Eyes: Negative.   Respiratory: Negative.   Cardiovascular: Negative.   Gastrointestinal: Negative.   Genitourinary: Negative.   Musculoskeletal: Positive for joint pain.  Skin: Negative.   Neurological: Negative.   Endo/Heme/Allergies: Negative.   Psychiatric/Behavioral: Negative.     Objective:  Physical Exam  Constitutional: He is oriented to person, place, and time. He appears well-developed.  HENT:  Head: Atraumatic.  Eyes: EOM are normal.  Neck: Normal range of motion.  Cardiovascular: Normal rate and intact distal pulses.   Respiratory: Effort normal.  GI: Soft.  Genitourinary:  Genitourinary Comments: Deferred  Musculoskeletal:  Left knee fair ROm. LLE grossly n/v intact.  Neurological: He is alert and oriented to person,  place, and time.  Skin: Skin is warm and dry.    Vital signs in last 24 hours:    Labs:   Estimated body mass index is 26.04 kg/m as calculated from the following:   Height as of 03/07/16: 6' (1.829 m).   Weight as of 03/07/16: 87.1 kg (192 lb).   Imaging Review Plain radiographs demonstrate severe degenerative joint disease of the left knee(s). The overall alignment ismild varus. The bone quality  appears to be good for age and reported activity level.  Assessment/Plan:  End stage arthritis, left knee   The patient history, physical examination, clinical judgment of the provider and imaging studies are consistent with end stage degenerative joint disease of the left knee(s) and total knee arthroplasty is deemed medically necessary. The treatment options including medical management, injection therapy arthroscopy and arthroplasty were discussed at length. The risks and benefits of total knee arthroplasty were presented and reviewed. The risks due to aseptic loosening, infection, stiffness, patella tracking problems, thromboembolic complications and other imponderables were discussed. The patient acknowledged the explanation, agreed to proceed with the plan and consent was signed. Patient is being admitted for inpatient treatment for surgery, pain control, PT, OT, prophylactic antibiotics, VTE prophylaxis, progressive ambulation and ADL's and discharge planning. The patient is planning to be discharged home with home health services.

## 2016-03-13 DIAGNOSIS — Z23 Encounter for immunization: Secondary | ICD-10-CM | POA: Diagnosis not present

## 2016-03-18 ENCOUNTER — Ambulatory Visit: Payer: Self-pay | Admitting: Orthopedic Surgery

## 2016-03-19 DIAGNOSIS — D239 Other benign neoplasm of skin, unspecified: Secondary | ICD-10-CM | POA: Diagnosis not present

## 2016-03-20 DIAGNOSIS — T1512XA Foreign body in conjunctival sac, left eye, initial encounter: Secondary | ICD-10-CM | POA: Diagnosis not present

## 2016-03-21 ENCOUNTER — Encounter (HOSPITAL_COMMUNITY): Payer: Self-pay

## 2016-03-21 ENCOUNTER — Encounter (HOSPITAL_COMMUNITY)
Admission: RE | Admit: 2016-03-21 | Discharge: 2016-03-21 | Disposition: A | Discharge status: Home / Self Care | Payer: PPO | Source: Ambulatory Visit | Attending: Specialist | Admitting: Specialist

## 2016-03-21 DIAGNOSIS — Z01818 Encounter for other preprocedural examination: Secondary | ICD-10-CM | POA: Diagnosis not present

## 2016-03-21 HISTORY — DX: Personal history of urinary calculi: Z87.442

## 2016-03-21 LAB — BASIC METABOLIC PANEL
ANION GAP: 7 (ref 5–15)
BUN: 14 mg/dL (ref 6–20)
CALCIUM: 9.7 mg/dL (ref 8.9–10.3)
CO2: 27 mmol/L (ref 22–32)
CREATININE: 0.89 mg/dL (ref 0.61–1.24)
Chloride: 107 mmol/L (ref 101–111)
GFR calc non Af Amer: >60 mL/min (ref >60)
Glucose, Bld: 85 mg/dL (ref 65–99)
Potassium: 4.5 mmol/L (ref 3.5–5.1)
SODIUM: 141 mmol/L (ref 135–145)

## 2016-03-21 LAB — SURGICAL PCR SCREEN
MRSA, PCR: NEGATIVE
STAPHYLOCOCCUS AUREUS: NEGATIVE

## 2016-03-21 LAB — CBC
HEMATOCRIT: 47 % (ref 39.0–52.0)
HEMOGLOBIN: 15.9 g/dL (ref 13.0–17.0)
MCH: 31.4 pg (ref 26.0–34.0)
MCHC: 33.8 g/dL (ref 30.0–36.0)
MCV: 92.7 fL (ref 78.0–100.0)
Platelets: 179 10*3/uL (ref 150–400)
RBC: 5.07 MIL/uL (ref 4.22–5.81)
RDW: 13.8 % (ref 11.5–15.5)
WBC: 7.9 10*3/uL (ref 4.0–10.5)

## 2016-03-21 LAB — URINALYSIS, ROUTINE W REFLEX MICROSCOPIC
Bilirubin Urine: NEGATIVE
Glucose, UA: NEGATIVE mg/dL
HGB URINE DIPSTICK: NEGATIVE
Ketones, ur: NEGATIVE mg/dL
LEUKOCYTES UA: NEGATIVE
NITRITE: NEGATIVE
PROTEIN: NEGATIVE mg/dL
SPECIFIC GRAVITY, URINE: 1.021 (ref 1.005–1.030)
pH: 6 (ref 5.0–8.0)

## 2016-03-21 LAB — PROTIME-INR
INR: 0.97
Prothrombin Time: 12.8 seconds (ref 11.4–15.2)

## 2016-03-21 LAB — ABO/RH: ABO/RH(D): O POS

## 2016-03-21 LAB — APTT: aPTT: 33 seconds (ref 24–36)

## 2016-03-21 NOTE — Pre-Procedure Instructions (Signed)
EKG 02-27-16,  Stress 03-07-16 Epic. Clearance note 6'17- D. Pharr, with chart

## 2016-03-21 NOTE — Patient Instructions (Addendum)
Joseph Hood  03/21/2016   Your procedure is scheduled on: 03-29-16  Report to [facility] Main  Entrance take [facility]  elevators to 3rd floor to  [place] at  [place] AM.  Call this number if you have problems the morning of surgery [phone]   Remember: ONLY 1 PERSON MAY GO WITH YOU TO SHORT STAY TO GET  READY MORNING OF [place].  Do not eat food or drink liquids :After Midnight.     Take these medicines the morning of surgery with A SIP OF WATER:  Primidone. Sertraline. DO NOT TAKE ANY DIABETIC MEDICATIONS DAY OF YOUR SURGERY                               You may not have any metal on your body including hair pins and              piercings  Do not wear jewelry, make-up, lotions, powders or perfumes, deodorant             Do not wear nail polish.  Do not shave  48 hours prior to surgery.              Men may shave face and neck.   Do not bring valuables to the hospital. [place].  Contacts, dentures or bridgework may not be worn into surgery.  Leave suitcase in the car. After surgery it may be brought to your room.     Patients discharged the day of surgery will not be allowed to drive home.  Name and phone number of your driver: Joseph Hood, son- [phone] cell  Special Instructions: N/A              Please read over the following fact sheets you were given: _____________________________________________________________________             [facility] - Preparing for Surgery Before surgery, you can play an important role.  Because skin is not sterile, your skin needs to be as free of germs as possible.  You can reduce the number of germs on your skin by washing with CHG (chlorahexidine gluconate) soap before surgery.  CHG is an antiseptic cleaner which kills germs and bonds with the skin to continue killing germs even after washing. Please DO NOT use if you have an allergy to CHG or  antibacterial soaps.  If your skin becomes reddened/irritated stop using the CHG and inform your nurse when you arrive at Short Stay. Do not shave (including legs and underarms) for at least 48 hours prior to the first CHG shower.  You may shave your face/neck. Please follow these instructions carefully:  1.  Shower with CHG Soap the night before surgery and the  morning of Surgery.  2.  If you choose to wash your hair, wash your hair first as usual with your  normal  shampoo.  3.  After you shampoo, rinse your hair and body thoroughly to remove the  shampoo.                           4.  Use CHG as you would any other liquid soap.  You can apply chg directly  to the skin and wash                       Gently with a scrungie or clean washcloth.  5.  Apply the CHG Soap to your body ONLY FROM THE NECK DOWN.   Do not use on face/ open                           Wound or open sores. Avoid contact with eyes, ears mouth and genitals (private parts).                       Wash face,  Genitals (private parts) with your normal soap.             6.  Wash thoroughly, paying special attention to the area where your surgery  will be performed.  7.  Thoroughly rinse your body with warm water from the neck down.  8.  DO NOT shower/wash with your normal soap after using and rinsing off  the CHG Soap.                9.  Pat yourself dry with a clean towel.            10.  Wear clean pajamas.            11.  Place clean sheets on your bed the night of your first shower and do not  sleep with pets. Day of Surgery : Do not apply any lotions/deodorants the morning of surgery.  Please wear clean clothes to the hospital/surgery center.  FAILURE TO FOLLOW THESE INSTRUCTIONS MAY RESULT IN THE CANCELLATION OF YOUR SURGERY PATIENT SIGNATURE_________________________________  NURSE SIGNATURE__________________________________  ________________________________________________________________________   Joseph Hood  An incentive spirometer is a tool that can help keep your lungs clear and active. This tool measures how well you are filling your lungs with each breath. Taking long deep breaths may help reverse or decrease the chance of developing breathing (pulmonary) problems (especially infection) following:  A long period of time when you are unable to move or be active. BEFORE THE PROCEDURE   If the spirometer includes an indicator to show your best effort, your nurse or respiratory therapist will set it to a desired goal.  If possible, sit up straight or lean slightly forward. Try not to slouch.  Hold the incentive spirometer in an upright position. INSTRUCTIONS FOR USE  1. Sit on the edge of your bed if possible, or sit up as far as you can in bed or on a chair. 2. Hold the incentive spirometer in an upright position. 3. Breathe out normally. 4. Place the mouthpiece in your mouth and seal your lips tightly around it. 5. Breathe in slowly and as deeply as possible, raising the piston or the ball toward the top of the column. 6. Hold your breath for 3-5 seconds or for as long as possible. Allow the piston or ball to fall to the bottom of the column. 7. Remove the mouthpiece from your mouth and breathe out normally. 8. Rest for a few seconds and repeat Steps 1 through 7 at least 10 times every 1-2 hours when you are awake. Take your time and take a few normal breaths between deep breaths. 9. The spirometer may include an indicator to show your best effort. Use the indicator as a goal to work toward during each repetition. 10. After each  set of 10 deep breaths, practice coughing to be sure your lungs are clear. If you have an incision (the cut made at the time of surgery), support your incision when coughing by placing a pillow or rolled up towels firmly against it. Once you are able to get out of bed, walk around indoors and cough well. You may stop using the incentive spirometer when  instructed by your caregiver.  RISKS AND COMPLICATIONS  Take your time so you do not get dizzy or light-headed.  If you are in pain, you may need to take or ask for pain medication before doing incentive spirometry. It is harder to take a deep breath if you are having pain. AFTER USE  Rest and breathe slowly and easily.  It can be helpful to keep track of a log of your progress. Your caregiver can provide you with a simple table to help with this. If you are using the spirometer at home, follow these instructions: [place] IF:   You are having difficultly using the spirometer.  You have trouble using the spirometer as often as instructed.  Your pain medication is not giving enough relief while using the spirometer.  You develop fever of 100.5 F (38.1 C) or higher. SEEK IMMEDIATE MEDICAL CARE IF:   You cough up bloody sputum that had not been present before.  You develop fever of 102 F (38.9 C) or greater.  You develop worsening pain at or near the incision site. MAKE SURE YOU:   Understand these instructions.  Will watch your condition.  Will get help right away if you are not doing well or get worse. Document Released: 10/21/2006 Document Revised: 09/02/2011 Document Reviewed: 12/22/2006 [facility] Patient Information 2014 [place], [place].   ________________________________________________________________________

## 2016-03-29 ENCOUNTER — Inpatient Hospital Stay (HOSPITAL_COMMUNITY): Payer: PPO | Admitting: Anesthesiology

## 2016-03-29 ENCOUNTER — Inpatient Hospital Stay (HOSPITAL_COMMUNITY)
Admission: RE | Admit: 2016-03-29 | Discharge: 2016-04-01 | DRG: 470 | Disposition: A | Discharge status: Home / Self Care | Payer: PPO | Source: Ambulatory Visit | Attending: Specialist | Admitting: Specialist

## 2016-03-29 ENCOUNTER — Encounter (HOSPITAL_COMMUNITY): Payer: Self-pay | Admitting: *Deleted

## 2016-03-29 ENCOUNTER — Encounter (HOSPITAL_COMMUNITY)
Admission: RE | Disposition: A | Discharge status: Home / Self Care | Payer: Self-pay | Source: Ambulatory Visit | Attending: Specialist

## 2016-03-29 DIAGNOSIS — Z9861 Coronary angioplasty status: Secondary | ICD-10-CM | POA: Diagnosis not present

## 2016-03-29 DIAGNOSIS — Z9889 Other specified postprocedural states: Secondary | ICD-10-CM | POA: Diagnosis not present

## 2016-03-29 DIAGNOSIS — G8918 Other acute postprocedural pain: Secondary | ICD-10-CM | POA: Diagnosis not present

## 2016-03-29 DIAGNOSIS — M25562 Pain in left knee: Secondary | ICD-10-CM | POA: Diagnosis not present

## 2016-03-29 DIAGNOSIS — Z87442 Personal history of urinary calculi: Secondary | ICD-10-CM | POA: Diagnosis not present

## 2016-03-29 DIAGNOSIS — Z87891 Personal history of nicotine dependence: Secondary | ICD-10-CM

## 2016-03-29 DIAGNOSIS — Z96659 Presence of unspecified artificial knee joint: Secondary | ICD-10-CM

## 2016-03-29 DIAGNOSIS — Z888 Allergy status to other drugs, medicaments and biological substances status: Secondary | ICD-10-CM | POA: Diagnosis not present

## 2016-03-29 DIAGNOSIS — Z9049 Acquired absence of other specified parts of digestive tract: Secondary | ICD-10-CM | POA: Diagnosis not present

## 2016-03-29 DIAGNOSIS — M1712 Unilateral primary osteoarthritis, left knee: Principal | ICD-10-CM | POA: Diagnosis present

## 2016-03-29 DIAGNOSIS — E785 Hyperlipidemia, unspecified: Secondary | ICD-10-CM | POA: Diagnosis present

## 2016-03-29 DIAGNOSIS — Z9841 Cataract extraction status, right eye: Secondary | ICD-10-CM

## 2016-03-29 DIAGNOSIS — Z8672 Personal history of thrombophlebitis: Secondary | ICD-10-CM

## 2016-03-29 DIAGNOSIS — I2510 Atherosclerotic heart disease of native coronary artery without angina pectoris: Secondary | ICD-10-CM | POA: Diagnosis present

## 2016-03-29 HISTORY — PX: TOTAL KNEE ARTHROPLASTY: SHX125

## 2016-03-29 LAB — TYPE AND SCREEN
ABO/RH(D): O POS
ANTIBODY SCREEN: NEGATIVE

## 2016-03-29 SURGERY — ARTHROPLASTY, KNEE, TOTAL
Anesthesia: Monitor Anesthesia Care | Site: Knee | Laterality: Left

## 2016-03-29 MED ORDER — FENTANYL CITRATE (PF) 100 MCG/2ML IJ SOLN
50.0000 ug | INTRAMUSCULAR | Status: DC | PRN
  Administered 2016-03-29 (×2): 50 ug via INTRAVENOUS

## 2016-03-29 MED ORDER — ACETAMINOPHEN 650 MG RE SUPP: 650.0000 mg | Freq: Four times a day (QID) | RECTAL | Status: DC | PRN

## 2016-03-29 MED ORDER — OXYCODONE HCL 5 MG PO TABS: 5.0000 mg | ORAL_TABLET | ORAL | 0 refills | Status: DC | PRN

## 2016-03-29 MED ORDER — SODIUM CHLORIDE 0.9 % IV SOLN
INTRAVENOUS | Status: DC
  Administered 2016-03-29: 1000 mL via INTRAVENOUS
  Administered 2016-03-30: 04:00:00 via INTRAVENOUS

## 2016-03-29 MED ORDER — PHENOL 1.4 % MT LIQD: 1.0000 | OROMUCOSAL | Status: DC | PRN

## 2016-03-29 MED ORDER — MIDAZOLAM HCL 5 MG/5ML IJ SOLN
INTRAMUSCULAR | Status: DC | PRN
  Administered 2016-03-29: 1 mg via INTRAVENOUS

## 2016-03-29 MED ORDER — HYDROMORPHONE HCL 1 MG/ML IJ SOLN: 0.2500 mg | INTRAMUSCULAR | Status: DC | PRN

## 2016-03-29 MED ORDER — FERROUS SULFATE 325 (65 FE) MG PO TABS
325.0000 mg | ORAL_TABLET | Freq: Three times a day (TID) | ORAL | Status: DC
  Administered 2016-03-30 – 2016-03-31 (×6): 325 mg via ORAL
  Filled 2016-03-29 (×7): qty 1

## 2016-03-29 MED ORDER — OXYCODONE HCL 5 MG PO TABS
5.0000 mg | ORAL_TABLET | ORAL | Status: DC | PRN
  Administered 2016-03-29 (×5): 5 mg via ORAL
  Administered 2016-03-30 (×2): 10 mg via ORAL
  Administered 2016-03-30 (×2): 5 mg via ORAL
  Administered 2016-03-31 (×3): 10 mg via ORAL
  Administered 2016-04-01 (×2): 5 mg via ORAL
  Filled 2016-03-29: qty 2
  Filled 2016-03-29 (×2): qty 1
  Filled 2016-03-29 (×3): qty 2
  Filled 2016-03-29 (×2): qty 1
  Filled 2016-03-29 (×2): qty 2
  Filled 2016-03-29: qty 1
  Filled 2016-03-29: qty 2
  Filled 2016-03-29: qty 1

## 2016-03-29 MED ORDER — PRIMIDONE 250 MG PO TABS
250.0000 mg | ORAL_TABLET | Freq: Two times a day (BID) | ORAL | Status: DC
  Administered 2016-03-29 – 2016-04-01 (×6): 250 mg via ORAL
  Filled 2016-03-29 (×6): qty 1

## 2016-03-29 MED ORDER — ONDANSETRON HCL 4 MG PO TABS: 4.0000 mg | ORAL_TABLET | Freq: Four times a day (QID) | ORAL | Status: DC | PRN

## 2016-03-29 MED ORDER — ROPIVACAINE HCL 7.5 MG/ML IJ SOLN
INTRAMUSCULAR | Status: DC | PRN
  Administered 2016-03-29: 20 mL via PERINEURAL

## 2016-03-29 MED ORDER — LACTATED RINGERS IV SOLN: INTRAVENOUS | Status: DC

## 2016-03-29 MED ORDER — METOCLOPRAMIDE HCL 5 MG/ML IJ SOLN: 5.0000 mg | Freq: Three times a day (TID) | INTRAMUSCULAR | Status: DC | PRN

## 2016-03-29 MED ORDER — DEXAMETHASONE SODIUM PHOSPHATE 10 MG/ML IJ SOLN
INTRAMUSCULAR | Status: DC | PRN
  Administered 2016-03-29: 10 mg via INTRAVENOUS

## 2016-03-29 MED ORDER — VANCOMYCIN HCL IN DEXTROSE 1-5 GM/200ML-% IV SOLN
1000.0000 mg | INTRAVENOUS | Status: AC
  Administered 2016-03-29: 1000 mg via INTRAVENOUS
  Filled 2016-03-29: qty 200

## 2016-03-29 MED ORDER — SIMVASTATIN 20 MG PO TABS
20.0000 mg | ORAL_TABLET | Freq: Every day | ORAL | Status: DC
  Administered 2016-03-29 – 2016-03-31 (×3): 20 mg via ORAL
  Filled 2016-03-29 (×5): qty 1

## 2016-03-29 MED ORDER — PROPOFOL 10 MG/ML IV BOLUS
INTRAVENOUS | Status: AC
  Filled 2016-03-29: qty 40

## 2016-03-29 MED ORDER — DEXAMETHASONE SODIUM PHOSPHATE 10 MG/ML IJ SOLN
10.0000 mg | Freq: Once | INTRAMUSCULAR | Status: AC
  Administered 2016-03-30: 10 mg via INTRAVENOUS
  Filled 2016-03-29: qty 1

## 2016-03-29 MED ORDER — PROMETHAZINE HCL 25 MG/ML IJ SOLN: 6.2500 mg | INTRAMUSCULAR | Status: DC | PRN

## 2016-03-29 MED ORDER — SODIUM CHLORIDE 0.9 % IJ SOLN
INTRAMUSCULAR | Status: DC | PRN
  Administered 2016-03-29: 20 mL

## 2016-03-29 MED ORDER — METHOCARBAMOL 500 MG PO TABS
500.0000 mg | ORAL_TABLET | Freq: Four times a day (QID) | ORAL | Status: DC | PRN
  Administered 2016-03-29 – 2016-04-01 (×4): 500 mg via ORAL
  Filled 2016-03-29 (×4): qty 1

## 2016-03-29 MED ORDER — BISACODYL 5 MG PO TBEC: 5.0000 mg | DELAYED_RELEASE_TABLET | Freq: Every day | ORAL | Status: DC | PRN

## 2016-03-29 MED ORDER — KETOROLAC TROMETHAMINE 30 MG/ML IJ SOLN
INTRAMUSCULAR | Status: DC | PRN
  Administered 2016-03-29: 30 mg

## 2016-03-29 MED ORDER — ENOXAPARIN SODIUM 30 MG/0.3ML ~~LOC~~ SOLN
30.0000 mg | Freq: Two times a day (BID) | SUBCUTANEOUS | Status: DC
  Administered 2016-03-30 – 2016-04-01 (×5): 30 mg via SUBCUTANEOUS
  Filled 2016-03-29 (×5): qty 0.3

## 2016-03-29 MED ORDER — VANCOMYCIN HCL IN DEXTROSE 1-5 GM/200ML-% IV SOLN
1000.0000 mg | Freq: Two times a day (BID) | INTRAVENOUS | Status: AC
Start: 2016-03-29 — End: 2016-03-30
  Administered 2016-03-29: 1000 mg via INTRAVENOUS
  Filled 2016-03-29: qty 200

## 2016-03-29 MED ORDER — DIPHENHYDRAMINE HCL 12.5 MG/5ML PO ELIX
12.5000 mg | ORAL_SOLUTION | ORAL | Status: DC | PRN
  Administered 2016-03-30: 25 mg via ORAL
  Filled 2016-03-29: qty 10

## 2016-03-29 MED ORDER — BACLOFEN 10 MG PO TABS: 10.0000 mg | ORAL_TABLET | Freq: Three times a day (TID) | ORAL | 2 refills | Status: DC | PRN

## 2016-03-29 MED ORDER — BUPIVACAINE HCL (PF) 0.75 % IJ SOLN
INTRAMUSCULAR | Status: DC | PRN
  Administered 2016-03-29: 2 mL via INTRATHECAL

## 2016-03-29 MED ORDER — METHOCARBAMOL 1000 MG/10ML IJ SOLN
500.0000 mg | Freq: Four times a day (QID) | INTRAVENOUS | Status: DC | PRN
  Administered 2016-03-29: 500 mg via INTRAVENOUS
  Filled 2016-03-29: qty 550
  Filled 2016-03-29: qty 5

## 2016-03-29 MED ORDER — ASPIRIN EC 325 MG PO TBEC: 325.0000 mg | DELAYED_RELEASE_TABLET | Freq: Two times a day (BID) | ORAL | 0 refills | Status: DC

## 2016-03-29 MED ORDER — BUPIVACAINE HCL (PF) 0.25 % IJ SOLN
INTRAMUSCULAR | Status: DC | PRN
  Administered 2016-03-29: 30 mL

## 2016-03-29 MED ORDER — HYDROMORPHONE HCL 1 MG/ML IJ SOLN
1.0000 mg | INTRAMUSCULAR | Status: DC | PRN
  Administered 2016-03-30 (×3): 1 mg via INTRAVENOUS
  Filled 2016-03-29 (×5): qty 1

## 2016-03-29 MED ORDER — KETOROLAC TROMETHAMINE 30 MG/ML IJ SOLN
INTRAMUSCULAR | Status: AC
Start: 2016-03-29 — End: 2016-03-29
  Filled 2016-03-29: qty 1

## 2016-03-29 MED ORDER — ONDANSETRON HCL 4 MG/2ML IJ SOLN
4.0000 mg | Freq: Four times a day (QID) | INTRAMUSCULAR | Status: DC | PRN
  Filled 2016-03-29: qty 2

## 2016-03-29 MED ORDER — SODIUM CHLORIDE 0.9 % IR SOLN
Status: DC | PRN
  Administered 2016-03-29: 1000 mL

## 2016-03-29 MED ORDER — MAGNESIUM CITRATE PO SOLN: 1.0000 | Freq: Once | ORAL | Status: DC | PRN

## 2016-03-29 MED ORDER — SODIUM CHLORIDE 0.9 % IJ SOLN
INTRAMUSCULAR | Status: AC
  Filled 2016-03-29: qty 50

## 2016-03-29 MED ORDER — POVIDONE-IODINE 7.5 % EX SOLN: Freq: Once | CUTANEOUS | Status: DC

## 2016-03-29 MED ORDER — MENTHOL 3 MG MT LOZG: 1.0000 | LOZENGE | OROMUCOSAL | Status: DC | PRN

## 2016-03-29 MED ORDER — LACTATED RINGERS IV SOLN
INTRAVENOUS | Status: DC
  Administered 2016-03-29 (×2): via INTRAVENOUS

## 2016-03-29 MED ORDER — MIDAZOLAM HCL 2 MG/2ML IJ SOLN
INTRAMUSCULAR | Status: AC
  Filled 2016-03-29: qty 2

## 2016-03-29 MED ORDER — PHENYLEPHRINE HCL 10 MG/ML IJ SOLN
INTRAMUSCULAR | Status: DC | PRN
  Administered 2016-03-29 (×6): 80 ug via INTRAVENOUS

## 2016-03-29 MED ORDER — FENTANYL CITRATE (PF) 100 MCG/2ML IJ SOLN
INTRAMUSCULAR | Status: AC
  Filled 2016-03-29: qty 2

## 2016-03-29 MED ORDER — METOCLOPRAMIDE HCL 5 MG PO TABS: 5.0000 mg | ORAL_TABLET | Freq: Three times a day (TID) | ORAL | Status: DC | PRN

## 2016-03-29 MED ORDER — MIDAZOLAM HCL 5 MG/ML IJ SOLN
1.0000 mg | INTRAMUSCULAR | Status: DC | PRN
  Administered 2016-03-29: 1 mg via INTRAVENOUS

## 2016-03-29 MED ORDER — ACETAMINOPHEN 325 MG PO TABS
650.0000 mg | ORAL_TABLET | Freq: Four times a day (QID) | ORAL | Status: DC | PRN
  Administered 2016-03-29 – 2016-04-01 (×3): 650 mg via ORAL
  Filled 2016-03-29 (×3): qty 2

## 2016-03-29 MED ORDER — PROPOFOL 500 MG/50ML IV EMUL
INTRAVENOUS | Status: DC | PRN
  Administered 2016-03-29: 50 ug/kg/min via INTRAVENOUS

## 2016-03-29 MED ORDER — SERTRALINE HCL 50 MG PO TABS
25.0000 mg | ORAL_TABLET | Freq: Every day | ORAL | Status: DC
  Administered 2016-03-30 – 2016-04-01 (×3): 25 mg via ORAL
  Filled 2016-03-29 (×4): qty 1

## 2016-03-29 MED ORDER — ROPIVACAINE HCL 7.5 MG/ML IJ SOLN
INTRAMUSCULAR | Status: AC
  Filled 2016-03-29: qty 20

## 2016-03-29 MED ORDER — BUPIVACAINE HCL (PF) 0.25 % IJ SOLN
INTRAMUSCULAR | Status: AC
Start: 2016-03-29 — End: 2016-03-29
  Filled 2016-03-29: qty 30

## 2016-03-29 MED ORDER — DOCUSATE SODIUM 100 MG PO CAPS
100.0000 mg | ORAL_CAPSULE | Freq: Two times a day (BID) | ORAL | Status: DC
  Administered 2016-03-29 – 2016-04-01 (×6): 100 mg via ORAL
  Filled 2016-03-29 (×6): qty 1

## 2016-03-29 MED ORDER — ALUM & MAG HYDROXIDE-SIMETH 200-200-20 MG/5ML PO SUSP: 30.0000 mL | ORAL | Status: DC | PRN

## 2016-03-29 MED ORDER — POLYETHYLENE GLYCOL 3350 17 G PO PACK: 17.0000 g | PACK | Freq: Every day | ORAL | Status: DC | PRN

## 2016-03-29 MED ORDER — PHENYLEPHRINE 40 MCG/ML (10ML) SYRINGE FOR IV PUSH (FOR BLOOD PRESSURE SUPPORT)
PREFILLED_SYRINGE | INTRAVENOUS | Status: AC
  Filled 2016-03-29: qty 10

## 2016-03-29 SURGICAL SUPPLY — 57 items
BAG DECANTER FOR FLEXI CONT (MISCELLANEOUS) IMPLANT
BAG ZIPLOCK 12X15 (MISCELLANEOUS) ×6 IMPLANT
BANDAGE ACE 4X5 VEL STRL LF (GAUZE/BANDAGES/DRESSINGS) ×3 IMPLANT
BANDAGE ACE 6X5 VEL STRL LF (GAUZE/BANDAGES/DRESSINGS) ×3 IMPLANT
BLADE SAG 18X100X1.27 (BLADE) ×3 IMPLANT
BLADE SAW SGTL 13.0X1.19X90.0M (BLADE) ×3 IMPLANT
CAP KNEE TOTAL 3 SIGMA ×3 IMPLANT
CEMENT HV SMART SET (Cement) ×6 IMPLANT
CLOTH BEACON ORANGE TIMEOUT ST (SAFETY) ×3 IMPLANT
CUFF TOURN SGL QUICK 34 (TOURNIQUET CUFF) ×2
CUFF TRNQT CYL 34X4X40X1 (TOURNIQUET CUFF) ×1 IMPLANT
DECANTER SPIKE VIAL GLASS SM (MISCELLANEOUS) ×3 IMPLANT
DERMABOND ADVANCED (GAUZE/BANDAGES/DRESSINGS) ×2
DERMABOND ADVANCED .7 DNX12 (GAUZE/BANDAGES/DRESSINGS) ×1 IMPLANT
DRAPE U-SHAPE 47X51 STRL (DRAPES) ×3 IMPLANT
DRSG AQUACEL AG ADV 3.5X10 (GAUZE/BANDAGES/DRESSINGS) ×3 IMPLANT
DRSG TEGADERM 4X4.75 (GAUZE/BANDAGES/DRESSINGS) ×3 IMPLANT
DURAPREP 26ML APPLICATOR (WOUND CARE) ×6 IMPLANT
ELECT REM PT RETURN 9FT ADLT (ELECTROSURGICAL) ×3
ELECTRODE REM PT RTRN 9FT ADLT (ELECTROSURGICAL) ×1 IMPLANT
EVACUATOR 1/8 PVC DRAIN (DRAIN) ×3 IMPLANT
GAUZE SPONGE 2X2 8PLY STRL LF (GAUZE/BANDAGES/DRESSINGS) ×1 IMPLANT
GLOVE BIOGEL PI IND STRL 8 (GLOVE) ×2 IMPLANT
GLOVE BIOGEL PI INDICATOR 8 (GLOVE) ×4
GLOVE ECLIPSE 8.0 STRL XLNG CF (GLOVE) ×6 IMPLANT
GLOVE SURG ORTHO 9.0 STRL STRW (GLOVE) ×3 IMPLANT
GLOVE SURG SS PI 7.5 STRL IVOR (GLOVE) ×3 IMPLANT
GOWN STRL REUS W/TWL XL LVL3 (GOWN DISPOSABLE) ×6 IMPLANT
HANDPIECE INTERPULSE COAX TIP (DISPOSABLE) ×2
IMMOBILIZER KNEE 20 (SOFTGOODS) ×3
IMMOBILIZER KNEE 20 THIGH 36 (SOFTGOODS) ×1 IMPLANT
NS IRRIG 1000ML POUR BTL (IV SOLUTION) ×3 IMPLANT
PACK TOTAL KNEE CUSTOM (KITS) ×3 IMPLANT
POSITIONER SURGICAL ARM (MISCELLANEOUS) ×3 IMPLANT
SET HNDPC FAN SPRY TIP SCT (DISPOSABLE) ×1 IMPLANT
SET PAD KNEE POSITIONER (MISCELLANEOUS) ×3 IMPLANT
SPONGE GAUZE 2X2 STER 10/PKG (GAUZE/BANDAGES/DRESSINGS) ×2
SPONGE LAP 18X18 X RAY DECT (DISPOSABLE) IMPLANT
SPONGE SURGIFOAM ABS GEL 100 (HEMOSTASIS) ×3 IMPLANT
STOCKINETTE 6  STRL (DRAPES) ×2
STOCKINETTE 6 STRL (DRAPES) ×1 IMPLANT
SUCTION FRAZIER HANDLE 12FR (TUBING) ×2
SUCTION TUBE FRAZIER 12FR DISP (TUBING) ×1 IMPLANT
SUT BONE WAX W31G (SUTURE) IMPLANT
SUT MNCRL AB 3-0 PS2 18 (SUTURE) ×3 IMPLANT
SUT VIC AB 1 CT1 27 (SUTURE) ×8
SUT VIC AB 1 CT1 27XBRD ANTBC (SUTURE) ×4 IMPLANT
SUT VIC AB 2-0 CT1 27 (SUTURE) ×4
SUT VIC AB 2-0 CT1 TAPERPNT 27 (SUTURE) ×2 IMPLANT
SUT VLOC 180 0 24IN GS25 (SUTURE) ×3 IMPLANT
SYR 50ML LL SCALE MARK (SYRINGE) ×3 IMPLANT
TAPE STRIPS DRAPE STRL (GAUZE/BANDAGES/DRESSINGS) ×3 IMPLANT
TOWER CARTRIDGE SMART MIX (DISPOSABLE) ×3 IMPLANT
TRAY FOLEY W/METER SILVER 16FR (SET/KITS/TRAYS/PACK) ×3 IMPLANT
WATER STERILE IRR 1500ML POUR (IV SOLUTION) ×6 IMPLANT
WRAP KNEE MAXI GEL POST OP (GAUZE/BANDAGES/DRESSINGS) ×3 IMPLANT
YANKAUER SUCT BULB TIP 10FT TU (MISCELLANEOUS) ×3 IMPLANT

## 2016-03-29 NOTE — Anesthesia Preprocedure Evaluation (Addendum)
Anesthesia Evaluation  Patient identified by MRN, date of birth, ID band Patient awake    Reviewed: Allergy & Precautions, NPO status , Patient's Chart, lab work & pertinent test results  Airway Mallampati: II  TM Distance: >3 FB Neck ROM: Full    Dental   Pulmonary former smoker,    breath sounds clear to auscultation       Cardiovascular + CAD   Rhythm:Regular Rate:Normal  02/2016 nuclear stress test:  The left ventricular ejection fraction is normal (55-65%).  Nuclear stress EF: 59%.  There was no ST segment deviation noted during stress.  The study is normal.  This is a low risk study.   Neuro/Psych Anxiety Depression negative neurological ROS     GI/Hepatic negative GI ROS, Neg liver ROS,   Endo/Other  negative endocrine ROS  Renal/GU negative Renal ROS     Musculoskeletal   Abdominal   Peds  Hematology negative hematology ROS (+)   Anesthesia Other Findings   Reproductive/Obstetrics                             Lab Results  Component Value Date   WBC 7.9 03/21/2016   HGB 15.9 03/21/2016   HCT 47.0 03/21/2016   MCV 92.7 03/21/2016   PLT 179 03/21/2016   Lab Results  Component Value Date   CREATININE 0.89 03/21/2016   BUN 14 03/21/2016   NA 141 03/21/2016   K 4.5 03/21/2016   CL 107 03/21/2016   CO2 27 03/21/2016   Lab Results  Component Value Date   INR 0.97 03/21/2016    Anesthesia Physical Anesthesia Plan  ASA: III  Anesthesia Plan: MAC, Regional and Spinal   Post-op Pain Management:  Regional for Post-op pain   Induction: Intravenous  Airway Management Planned: Simple Face Mask and Natural Airway  Additional Equipment:   Intra-op Plan:   Post-operative Plan:   Informed Consent: I have reviewed the patients History and Physical, chart, labs and discussed the procedure including the risks, benefits and alternatives for the proposed anesthesia with  the patient or authorized representative who has indicated his/her understanding and acceptance.     Plan Discussed with: CRNA  Anesthesia Plan Comments:        Anesthesia Quick Evaluation

## 2016-03-29 NOTE — Op Note (Signed)
DATE OF SURGERY:  03/29/2016  TIME: 12:47 PM  PATIENT NAME:  Joseph Hood    AGE: 75 y.o.   PRE-OPERATIVE DIAGNOSIS:  left knee OA  POST-OPERATIVE DIAGNOSIS:  left knee OA  PROCEDURE:  Procedure(s): LEFT TOTAL KNEE ARTHROPLASTY  SURGEON:  Viki Carrera ANDREW  ASSISTANT:  Bryson Stilwell, PA-C, present and scrubbed throughout the case, critical for assistance with exposure, retraction, instrumentation, and closure.  OPERATIVE IMPLANTS: Depuy PFC Sigma Rotating Platform.  Femur size 5, Tibia size 4, Patella size 42 3-peg oval button, with a 10 mm polyethylene insert.   PREOPERATIVE INDICATIONS:   Joseph Hood is a 75 y.o. year old male with end stage bone on bone arthritis of the knee who failed conservative treatment and elected for Total Knee Arthroplasty.   The risks, benefits, and alternatives were discussed at length including but not limited to the risks of infection, bleeding, nerve injury, stiffness, blood clots, the need for revision surgery, cardiopulmonary complications, among others, and they were willing to proceed.  OPERATIVE DESCRIPTION:  The patient was brought to the operative room and placed in a supine position.  Spinal anesthesia was administered.  IV antibiotics were given.  The lower extremity was prepped and draped in the usual sterile fashion.  Time out was performed.  The leg was elevated and exsanguinated and the tourniquet was inflated.  Anterior quadriceps tendon splitting approach was performed.  The patella was retracted and osteophytes were removed.  The anterior horn of the medial and lateral meniscus was removed and cruciate ligaments resected.   The distal femur was opened with the drill and the intramedullary distal femoral cutting jig was utilized, set at 5 degrees resecting 10 mm off the distal femur.  Care was taken to protect the collateral ligaments.  The distal femoral sizing jig was applied, taking care to avoid notching.  Then  the 4-in-1 cutting jig was applied and the anterior and posterior femur was cut, along with the chamfer cuts.    Then the extramedullary tibial cutting jig was utilized making the appropriate cut using the anterior tibial crest as a reference building in appropriate posterior slope.  Care was taken during the cut to protect the medial and collateral ligaments.  The proximal tibia was removed along with the posterior horns of the menisci.   The posterior medial femoral osteophytes and posterior lateral femoral osteophytes were removed.    The flexion gap was then measured and was symmetric with the extension gap, measured at 10.  I completed the distal femoral preparation using the appropriate jig to prepare the box.  The patella was then measured, and cut with the saw.    The proximal tibia sized and prepared accordingly with the reamer and the punch, and then all components were trialed with the trial insert.  The knee was found to have excellent balance and full motion.    The above named components were then cemented into place and all excess cement was removed.  The trial polyethylene component was in place during cementation, and then was exchanged for the real polyethylene component.    The knee was easily taken through a range of motion and the patella tracked well and the knee irrigated copiously and the parapatellar and subcutaneous tissue closed with vicryl, and monocryl with steri strips for the skin.  The arthrotomy was closed at 90 of flexion. The wounds were dressed with sterile gauze and the tourniquet released and the patient was awakened and returned to the PACU  in stable and satisfactory condition.  There were no complications.  Total tourniquet time was 90 minutes.

## 2016-03-29 NOTE — Progress Notes (Addendum)
Patient pale, nauseous, and diaphoretic. Vitals taken and patient HR low. Apical pulse taken and was 42. Rapid response notified. Patient alert and orientated during episode. Patient HR in 60's within 5 minutes. Dr. Theda Sers notified, no new orders given.

## 2016-03-29 NOTE — Anesthesia Postprocedure Evaluation (Signed)
Anesthesia Post Note  Patient: Joseph Hood  Procedure(s) Performed: Procedure(s) (LRB): LEFT TOTAL KNEE ARTHROPLASTY (Left)  Patient location during evaluation: PACU Anesthesia Type: Spinal, MAC and Regional Level of consciousness: awake and alert Pain management: pain level controlled Vital Signs Assessment: post-procedure vital signs reviewed and stable Respiratory status: spontaneous breathing and respiratory function stable Cardiovascular status: blood pressure returned to baseline and stable Postop Assessment: spinal receding Anesthetic complications: no    Last Vitals:  Vitals:   03/29/16 1359 03/29/16 1501  BP: 140/76 119/76  Pulse: 61 64  Resp: 16   Temp: 36.2 C 36.2 C    Last Pain:  Vitals:   03/29/16 1507  TempSrc:   PainSc: 4                  Tiajuana Amass

## 2016-03-29 NOTE — Progress Notes (Signed)
Assisted Dr. Rob Fitzgerald with left, ultrasound guided, adductor canal block. Side rails up, monitors on throughout procedure. See vital signs in flow sheet. Tolerated Procedure well.  

## 2016-03-29 NOTE — Anesthesia Procedure Notes (Signed)
Anesthesia Regional Block:  Adductor canal block  Pre-Anesthetic Checklist: ,, timeout performed, Correct Patient, Correct Site, Correct Laterality, Correct Procedure, Correct Position, site marked, Risks and benefits discussed,  Surgical consent,  Pre-op evaluation,  At surgeon's request and post-op pain management  Laterality: Left  Prep: chloraprep       Needles:  Injection technique: Single-shot  Needle Type: Echogenic Needle     Needle Length: 9cm 9 cm Needle Gauge: 21 and 21 G    Additional Needles:  Procedures: ultrasound guided (picture in chart) Adductor canal block Narrative:  Start time: 03/29/2016 9:55 AM End time: 03/29/2016 10:03 AM Injection made incrementally with aspirations every 5 mL.  Performed by: Personally  Anesthesiologist: Suzette Battiest

## 2016-03-29 NOTE — Transfer of Care (Signed)
Immediate Anesthesia Transfer of Care Note  Patient: Joseph Hood  Procedure(s) Performed: Procedure(s): LEFT TOTAL KNEE ARTHROPLASTY (Left)  Patient Location: PACU  Anesthesia Type:Spinal  Level of Consciousness: awake, alert  and sedated  Airway & Oxygen Therapy: Patient Spontanous Breathing and Patient connected to face mask oxygen  Post-op Assessment: Report given to RN and Post -op Vital signs reviewed and stable  Post vital signs: Reviewed and stable  Last Vitals:  Vitals:   03/29/16 1021 03/29/16 1022  BP:    Pulse: 68 66  Resp: 11 13  Temp:      Last Pain:  Vitals:   03/29/16 0824  TempSrc: Oral      Patients Stated Pain Goal: 4 ([id number])  Complications: No apparent anesthesia complications

## 2016-03-29 NOTE — Anesthesia Procedure Notes (Signed)
Spinal  Start time: 03/29/2016 10:47 AM End time: 03/29/2016 10:51 AM Staffing Resident/CRNA: Gean Maidens Performed: resident/CRNA  Preanesthetic Checklist Completed: patient identified, site marked, surgical consent, pre-op evaluation, timeout performed, IV checked, risks and benefits discussed and monitors and equipment checked Spinal Block Patient position: sitting Prep: Betadine Patient monitoring: heart rate, continuous pulse ox and blood pressure Approach: midline Location: L3-4 Needle Needle gauge: 24 G Needle length: 9 cm Needle insertion depth: 7 cm Additional Notes Pt sitting position, sterile prep and drape, negative paresthesia, negative heme.

## 2016-03-29 NOTE — Interval H&P Note (Signed)
History and Physical Interval Note:  03/29/2016 9:53 AM  Joseph Hood  has presented today for surgery, with the diagnosis of left knee OA  The various methods of treatment have been discussed with the patient and family. After consideration of risks, benefits and other options for treatment, the patient has consented to  Procedure(s): LEFT TOTAL KNEE ARTHROPLASTY (Left) as a surgical intervention .  The patient's history has been reviewed, patient examined, no change in status, stable for surgery.  I have reviewed the patient's chart and labs.  Questions were answered to the patient's satisfaction.     [name] ANDREW

## 2016-03-30 DIAGNOSIS — R269 Unspecified abnormalities of gait and mobility: Secondary | ICD-10-CM | POA: Diagnosis not present

## 2016-03-30 LAB — BASIC METABOLIC PANEL
Anion gap: 9 (ref 5–15)
BUN: 16 mg/dL (ref 6–20)
CO2: 23 mmol/L (ref 22–32)
CREATININE: 0.9 mg/dL (ref 0.61–1.24)
Calcium: 8.4 mg/dL — ABNORMAL LOW (ref 8.9–10.3)
Chloride: 105 mmol/L (ref 101–111)
Glucose, Bld: 119 mg/dL — ABNORMAL HIGH (ref 65–99)
Potassium: 4 mmol/L (ref 3.5–5.1)
SODIUM: 137 mmol/L (ref 135–145)

## 2016-03-30 LAB — CBC
HEMATOCRIT: 39.1 % (ref 39.0–52.0)
HEMOGLOBIN: 12.8 g/dL — AB (ref 13.0–17.0)
MCH: 30.8 pg (ref 26.0–34.0)
MCHC: 32.7 g/dL (ref 30.0–36.0)
MCV: 94.2 fL (ref 78.0–100.0)
PLATELETS: 150 10*3/uL (ref 150–400)
RBC: 4.15 MIL/uL — AB (ref 4.22–5.81)
RDW: 14 % (ref 11.5–15.5)
WBC: 12.3 10*3/uL — ABNORMAL HIGH (ref 4.0–10.5)

## 2016-03-30 NOTE — Evaluation (Signed)
Occupational Therapy Evaluation Patient Details Name: Joseph Hood MRN: [id number] DOB: 1941-03-07 Today's Date: 03/30/2016    History of Present Illness s/p L TKA   Clinical Impression   This 75 year old man was admitted for the above sx. He will benefit from continued OT in acute setting. Goals are for min guard level.  Pt is currently at min A level. He was independent prior to admission and will have family's assistance as needed    Follow Up Recommendations  Supervision/Assistance - 24 hour    Equipment Recommendations  None recommended by OT (does not want 3;1)    Recommendations for Other Services       Precautions / Restrictions Precautions Precautions: Fall;Knee Required Braces or Orthoses: Knee Immobilizer - Left Restrictions Weight Bearing Restrictions: No Other Position/Activity Restrictions: WBAT      Mobility Bed Mobility Overal bed mobility: Needs Assistance Bed Mobility: Supine to Sit     Supine to sit: Min assist     Sit to supine:  Min A for leg  Transfers Overall transfer level: Needs assistance Equipment used: Rolling walker (2 wheeled) Transfers: Sit to/from Stand Sit to Stand: Min assist;From elevated surface Stand pivot transfers: Min assist       General transfer comment: cues for hand placement and LLE position, mod assist to control descent    Balance                                            ADL Overall ADL's : Needs assistance/impaired     Grooming: Set up;Sitting   Upper Body Bathing: Set up;Sitting   Lower Body Bathing: Minimal assistance;Sit to/from stand   Upper Body Dressing : Set up;Sitting   Lower Body Dressing: Minimal assistance;Sit to/from stand   Toilet Transfer: Minimal assistance;Stand-pivot;RW (chair to bed)             General ADL Comments: pt slept poorly last night.  Assisted back to bed.  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  He has a lot of family nearby and daughter is coming  to assist for a week.       Vision     Perception     Praxis      Pertinent Vitals/Pain Pain Assessment: 0-10 Pain Score: 7  Pain Location: L knee Pain Descriptors / Indicators: Sore Pain Intervention(s): Limited activity within patient's tolerance;Monitored during session;Repositioned;Patient requesting pain meds-RN notified;Ice applied     Hand Dominance     Extremity/Trunk Assessment Upper Extremity Assessment Upper Extremity Assessment: Overall WFL for tasks assessed (has benign intention tremor)          Communication Communication Communication: No difficulties   Cognition Arousal/Alertness: Awake/alert Behavior During Therapy: WFL for tasks assessed/performed Overall Cognitive Status: Within Functional Limits for tasks assessed                     General Comments       Exercises       Shoulder Instructions      Home Living Family/patient expects to be discharged to:: Private residence Living Arrangements: Alone Available Help at Discharge: Family;Available 24 hours/day Type of Home: House Home Access: Level entry     Home Layout: One level     Bathroom Shower/Tub: Occupational psychologist: Standard     Home Equipment: Cane - single point;Crutches  Additional Comments: does not want 3:1; no seat in shower      Prior Functioning/Environment Level of Independence: Independent                 OT Problem List: Decreased strength;Decreased activity tolerance;Decreased knowledge of use of DME or AE;Pain   OT Treatment/Interventions: Self-care/ADL training;DME and/or AE instruction;Patient/family education    OT Goals(Current goals can be found in the care plan section) Acute Rehab OT Goals Patient Stated Goal: return to independence OT Goal Formulation: With patient Time For Goal Achievement: 04/06/16 Potential to Achieve Goals: Good ADL Goals Pt Will Perform Grooming: with min guard assist;standing Pt Will  Transfer to Toilet: with min guard assist;ambulating;regular height toilet Pt Will Perform Toileting - Clothing Manipulation and hygiene: with min guard assist;sit to/from stand Pt Will Perform Tub/Shower Transfer: Shower transfer;with min guard assist;ambulating;shower seat  OT Frequency: Min 2X/week   Barriers to D/C:            Co-evaluation              End of Session Nurse Communication: Patient requests pain meds  Activity Tolerance: Patient limited by fatigue Patient left: in bed;with call bell/phone within reach;with bed alarm set   Time: 1105-1130 OT Time Calculation (min): 25 min Charges:  OT General Charges $OT Visit: 1 Procedure OT Evaluation $OT Eval Low Complexity: 1 Procedure G-Codes:    Joseph Hood 13-Apr-2016, 11:41 AM Lesle Chris, OTR/L [phone] Apr 13, 2016

## 2016-03-30 NOTE — Evaluation (Signed)
Physical Therapy Evaluation Patient Details Name: Joseph Hood MRN: [id number] DOB: Aug 12, 1940 Today's Date: 03/30/2016   History of Present Illness  s/p L TKA; PMHx: resting tremors  Clinical Impression  Pt is s/p TKA resulting in the deficits listed below (see PT Problem List).   Pt will benefit from skilled PT to increase their independence and safety with mobility to allow discharge to the venue listed below. Pt mildly  diaphoertic while amb but denied such; chair to pt  For safety as BP 97/56  this am     Follow Up Recommendations Home health PT    Equipment Recommendations  Rolling walker with 5" wheels    Recommendations for Other Services       Precautions / Restrictions Precautions Precautions: Fall;Knee Required Braces or Orthoses: Knee Immobilizer - Left Restrictions Weight Bearing Restrictions: No Other Position/Activity Restrictions: WBAT      Mobility  Bed Mobility Overal bed mobility: Needs Assistance Bed Mobility: Supine to Sit     Supine to sit: Min assist     General bed mobility comments: assist with LLE, incr time  Transfers Overall transfer level: Needs assistance Equipment used: Rolling walker (2 wheeled) Transfers: Sit to/from Stand Sit to Stand: Min assist;From elevated surface;Mod assist Stand pivot transfers: Min assist       General transfer comment: cues for hand placement and LLE position, mod assist to control descent  Ambulation/Gait Ambulation/Gait assistance: Min assist Ambulation Distance (Feet): 50 Feet Assistive device: Rolling walker (2 wheeled) Gait Pattern/deviations: Step-to pattern;Decreased weight shift to left     General Gait Details: cues for sequence  Stairs            Wheelchair Mobility    Modified Rankin (Stroke Patients Only)       Balance                                             Pertinent Vitals/Pain Pain Assessment: 0-10 Pain Score: 7  Pain Location: L  knee Pain Descriptors / Indicators: Sore Pain Intervention(s): Limited activity within patient's tolerance;Monitored during session;Repositioned;Patient requesting pain meds-RN notified;Ice applied    Home Living Family/patient expects to be discharged to:: Private residence Living Arrangements: Alone Available Help at Discharge: Family;Available 24 hours/day Type of Home: House Home Access: Level entry     Home Layout: One level Home Equipment: Cane - single point;Crutches Additional Comments: does not want 3:1; no seat in shower    Prior Function Level of Independence: Independent               Hand Dominance        Extremity/Trunk Assessment   Upper Extremity Assessment: Overall WFL for tasks assessed (has benign intention tremor)           Lower Extremity Assessment: LLE deficits/detail   LLE Deficits / Details: ankle grossly WFL; knee extension and hip flexion 2/5 to 2+/5, post op pain and weakness     Communication   Communication: No difficulties  Cognition Arousal/Alertness: Awake/alert Behavior During Therapy: WFL for tasks assessed/performed Overall Cognitive Status: Within Functional Limits for tasks assessed                      General Comments      Exercises Total Joint Exercises Ankle Circles/Pumps: AROM;Both;10 reps Quad Sets: 5 reps;Both;AROM   Assessment/Plan    PT  Assessment Patient needs continued PT services  PT Problem List Decreased strength;Decreased range of motion;Decreased activity tolerance;Decreased mobility;Decreased knowledge of use of DME;Decreased knowledge of precautions;Pain          PT Treatment Interventions Gait training;Functional mobility training;DME instruction;Therapeutic activities;Therapeutic exercise;Patient/family education    PT Goals (Current goals can be found in the Care Plan section)  Acute Rehab PT Goals Patient Stated Goal: return to independence PT Goal Formulation: With patient Time  For Goal Achievement: 04/04/16 Potential to Achieve Goals: Good    Frequency 7X/week   Barriers to discharge        Co-evaluation               End of Session Equipment Utilized During Treatment: Gait belt Activity Tolerance: Patient tolerated treatment well Patient left: in chair;with call bell/phone within reach;with chair alarm set           Time: 0929-1000 PT Time Calculation (min) (ACUTE ONLY): 31 min   Charges:   PT Evaluation $PT Eval Low Complexity: 1 Procedure PT Treatments $Gait Training: 8-22 mins   PT G Codes:        Latise Dilley 04-12-16, 11:42 AM

## 2016-03-30 NOTE — Care Management Note (Signed)
Case Management Note  Patient Details  Name: Joseph Hood MRN: [id number] Date of Birth: 11-27-1940  Subjective/Objective:                  LEFT TOTAL KNEE ARTHROPLASTY (Left) Action/Plan: Discharge planning Expected Discharge Date:  03/30/16               Expected Discharge Plan:  [place]  In-House Referral:     Discharge planning Services  CM Consult  Post Acute Care Choice:  Home Health Choice offered to:  Patient  DME Arranged:  Walker rolling DME Agency:  [facility] ([facility]  [place]:  PT [place]:  [facility] (formerly [facility])  Status of Service:  Completed, signed off  If discussed at [employer], dates discussed:    Additional Comments: Cm spoke with pt to offer choice of home health agency. Pt chooses [facility] to render HHPT.  Referral called to [facility] rep, Tommi Emery. CM notified [place] DME rep, Reggie to please deliver the rolling walker to room prior to discharge.  No other CM needs were communicated. Dellie Catholic, RN 03/30/2016, 8:43 AM

## 2016-03-30 NOTE — Progress Notes (Signed)
Subjective: 1 Day Post-Op Procedure(s) (LRB): LEFT TOTAL KNEE ARTHROPLASTY (Left) Patient reports pain as well controlled.  Denies any other bradycardia episodes. Denies SOB, CP, of calf pain. Tolerating PO's. Reports a fair night.   Objective: Vital signs in last 24 hours: Temp:  [97.2 F (36.2 C)-98.2 F (36.8 C)] 98.2 F (36.8 C) (10/07 0640) Pulse Rate:  [42-79] 78 (10/07 0205) Resp:  [10-22] 16 (10/07 0640) BP: (90-160)/(56-91) 97/56 (10/07 0640) SpO2:  [91 %-100 %] 96 % (10/07 0640) Weight:  [87.1 kg (192 lb)] 87.1 kg (192 lb) (10/06 1359)  Intake/Output from previous day: 10/06 0701 - 10/07 0700 In: 3546.9 [P.O.:720; I.V.:2826.9] Out: 1400 [Urine:1240; Drains:135; Blood:25] Intake/Output this shift: No intake/output data recorded.   Recent Labs  03/30/16 0452  HGB 12.8*    Recent Labs  03/30/16 0452  WBC 12.3*  RBC 4.15*  HCT 39.1  PLT 150    Recent Labs  03/30/16 0452  NA 137  K 4.0  CL 105  CO2 23  BUN 16  CREATININE 0.90  GLUCOSE 119*  CALCIUM 8.4*   No results for input(s): LABPT, INR in the last 72 hours.  Alert and oriented x3. RRR, Lungs clear, BS x4. Left Calf soft and non tender. L knee dressing C/D/I. No DVT signs. No signs of infection or compartment syndrome. LLE grossly neurovascularly intact.  Drain D/c'ed. Tip intact.  Assessment/Plan: 1 Day Post-Op Procedure(s) (LRB): LEFT TOTAL KNEE ARTHROPLASTY (Left) Up with PT Advance diet Continue current care Lovenox to ASA at D/c Rx on chart Plan D/c home tomorrow  Lajean Manes 03/30/2016, 8:10 AM

## 2016-03-30 NOTE — Progress Notes (Signed)
Physical Therapy Treatment Patient Details Name: Joseph Hood MRN: [id number] DOB: 1940/07/23 Today's Date: 04-02-16    History of Present Illness s/p L TKA; PMHx: resting tremors    PT Comments    progressing  Follow Up Recommendations  Home health PT;Supervision for mobility/OOB     Equipment Recommendations  Rolling walker with 5" wheels    Recommendations for Other Services       Precautions / Restrictions Precautions Precautions: Fall;Knee Required Braces or Orthoses: Knee Immobilizer - Left Restrictions Weight Bearing Restrictions: No Other Position/Activity Restrictions: WBAT    Mobility  Bed Mobility                  Transfers                 General transfer comment:  (deferred d/t pain adn fatigue; pt c/o no sleep)  Ambulation/Gait                 Stairs            Wheelchair Mobility    Modified Rankin (Stroke Patients Only)       Balance                                    Cognition Arousal/Alertness: Awake/alert Behavior During Therapy: WFL for tasks assessed/performed Overall Cognitive Status: Within Functional Limits for tasks assessed                      Exercises Total Joint Exercises Ankle Circles/Pumps: AROM;Both;10 reps Quad Sets: AROM;Strengthening;Both;10 reps Short Arc Quad: Left;10 reps;AAROM Heel Slides: AAROM;Left;10 reps Hip ABduction/ADduction: AAROM;Left;10 reps Straight Leg Raises: AAROM;Left;10 reps Goniometric ROM: grossly -10 to 65*    General Comments        Pertinent Vitals/Pain Pain Assessment: 0-10 Pain Score: 5  Pain Location: L knee Pain Descriptors / Indicators: Grimacing;Guarding Pain Intervention(s): Limited activity within patient's tolerance;Monitored during session;Premedicated before session    Home Living                      Prior Function            PT Goals (current goals can now be found in the care plan section)  Acute Rehab PT Goals Patient Stated Goal: return to independence PT Goal Formulation: With patient Time For Goal Achievement: 04/04/16 Potential to Achieve Goals: Good Progress towards PT goals: Progressing toward goals    Frequency    7X/week      PT Plan Current plan remains appropriate    Co-evaluation             End of Session   Activity Tolerance: Patient tolerated treatment well Patient left: in bed;with call bell/phone within reach;with bed alarm set     Time: [id number] PT Time Calculation (min) (ACUTE ONLY): 13 min  Charges:  $Therapeutic Exercise: 8-22 mins                    G Codes:      Joseph Hood 02-Apr-2016, 4:09 PM

## 2016-03-30 NOTE — Clinical Social Work Note (Signed)
Disposition: Home with home health PT.  CSW signing off.  Nonnie Done, MSW, LCSW  ([id number]) [phone] Licensed Clinical Social Worker

## 2016-03-31 LAB — CBC
HEMATOCRIT: 37.5 % — AB (ref 39.0–52.0)
HEMOGLOBIN: 12.6 g/dL — AB (ref 13.0–17.0)
MCH: 31.3 pg (ref 26.0–34.0)
MCHC: 33.6 g/dL (ref 30.0–36.0)
MCV: 93.1 fL (ref 78.0–100.0)
Platelets: 144 10*3/uL — ABNORMAL LOW (ref 150–400)
RBC: 4.03 MIL/uL — AB (ref 4.22–5.81)
RDW: 14.3 % (ref 11.5–15.5)
WBC: 13.6 10*3/uL — ABNORMAL HIGH (ref 4.0–10.5)

## 2016-03-31 MED ORDER — PREMIER PROTEIN SHAKE: 2.0000 [oz_av] | Freq: Four times a day (QID) | ORAL | Status: DC

## 2016-03-31 NOTE — Progress Notes (Signed)
Discussed by phone with PA re pt not ready for discharge per PT. PA agreed to keep pt another night. Autumm Hattery, [employer]

## 2016-03-31 NOTE — Progress Notes (Signed)
   Subjective: 2 Days Post-Op Procedure(s) (LRB): LEFT TOTAL KNEE ARTHROPLASTY (Left) Patient reports pain as mild and moderate.   Patient seen in rounds for Dr. Theda Sers.  Apprehensive about going home.  Walked 50 feet yesterday. Will need two sessions of therapy today.  If he meets his goal, no nausea, tolerating PO meds, then home this afternoon.  If not ready, then tomorrow. Patient is well, but has had some minor complaints of pain in the knee, requiring pain medications Patient is ready to go home possibly this afternoon.  Objective: Vital signs in last 24 hours: Temp:  [98.6 F (37 C)-100.2 F (37.9 C)] 99.2 F (37.3 C) (10/08 0432) Pulse Rate:  [84-104] 99 (10/08 0432) Resp:  [15-18] 18 (10/08 0432) BP: (107-132)/(58-76) 132/64 (10/08 0432) SpO2:  [90 %-92 %] 91 % (10/08 0432)  Intake/Output from previous day:  Intake/Output Summary (Last 24 hours) at 03/31/16 0730 Last data filed at 03/30/16 2137  Gross per 24 hour  Intake              840 ml  Output              350 ml  Net              490 ml    Intake/Output this shift: No intake/output data recorded.  Labs:  Recent Labs  03/30/16 0452 03/31/16 0453  HGB 12.8* 12.6*    Recent Labs  03/30/16 0452 03/31/16 0453  WBC 12.3* 13.6*  RBC 4.15* 4.03*  HCT 39.1 37.5*  PLT 150 144*    Recent Labs  03/30/16 0452  NA 137  K 4.0  CL 105  CO2 23  BUN 16  CREATININE 0.90  GLUCOSE 119*  CALCIUM 8.4*   No results for input(s): LABPT, INR in the last 72 hours.  EXAM: General - Patient is Alert, Appropriate and Oriented Extremity - Neurovascular intact Sensation intact distally Dorsiflexion/Plantar flexion intact Aquacel Dressing - clean, dry, no drainage Motor Function - intact, moving foot and toes well on exam.   Assessment/Plan: 2 Days Post-Op Procedure(s) (LRB): LEFT TOTAL KNEE ARTHROPLASTY (Left) Procedure(s) (LRB): LEFT TOTAL KNEE ARTHROPLASTY (Left) Past Medical History:  Diagnosis Date    . Angina   . Anxiety   . BPH (benign prostatic hyperplasia)   . Coronary artery disease   . Diverticulosis   . DVT (deep venous thrombosis) (HCC) 1990   LLE x2-  left calf is slight larger than right  . Essential tremor   . Heart disease   . History of kidney stones   . Hypercholesterolemia   . RBBB (right bundle branch block with left anterior fascicular block)   . Uveitis    bilateral  . Varicose veins of left lower extremity with pain    Active Problems:   Primary osteoarthritis of one knee, left   S/P knee replacement  Estimated body mass index is 26.04 kg/m as calculated from the following:   Height as of this encounter: 6' (1.829 m).   Weight as of this encounter: 87.1 kg (192 lb). Up with therapy Discharge home with home health Diet - Cardiac diet Follow up - in 2 weeks Activity - WBAT Disposition - Home Condition Upon Discharge - pending D/C Meds - See DC Summary DVT Prophylaxis - Aspirin  Arlee Muslim, PA-C Orthopaedic Surgery 03/31/2016, 7:30 AM

## 2016-03-31 NOTE — Progress Notes (Signed)
Physical Therapy Treatment Patient Details Name: Joseph Hood MRN: [id number] DOB: [birth year] Today's Date: 03/31/2016    History of Present Illness s/p L TKA; PMHx: resting tremors    PT Comments    Pt progressing slowly; tremors significantly worse today compared to yesterday(all extremities); noted pt with mild STM deficits today--repeating self and not recalling events of yesterday;pt may benefit from another day in hospital; If should D/C today will ceraintly need 24hr assist at home; will see again in pm, pt has supportive family but they were not present for this PT session  Follow Up Recommendations  Home health PT;24 hr Supervision      Equipment Recommendations  Rolling walker with 5" wheels    Recommendations for Other Services       Precautions / Restrictions Precautions Precautions: Fall;Knee Required Braces or Orthoses: Knee Immobilizer - Left Restrictions Other Position/Activity Restrictions: WBAT    Mobility  Bed Mobility Overal bed mobility: Needs Assistance Bed Mobility: Supine to Sit     Supine to sit: Min assist     General bed mobility comments: assist with LLE, incr time  Transfers Overall transfer level: Needs assistance Equipment used: Rolling walker (2 wheeled) Transfers: Sit to/from Stand Sit to Stand: Min assist;From elevated surface Stand pivot transfers: Min assist       General transfer comment: cues for UE/LE placement and assist to rise and stabilzie  Ambulation/Gait Ambulation/Gait assistance: Min assist Ambulation Distance (Feet): 70 Feet Assistive device: Rolling walker (2 wheeled) Gait Pattern/deviations: Step-to pattern;Decreased weight shift to left;Antalgic;Trunk flexed     General Gait Details: cues for posture, sequence, fatigues quickly--chair to pt/unable to amb back to room--also tremors increasing with activity this session   Stairs            Wheelchair Mobility    Modified Rankin (Stroke  Patients Only)       Balance                                    Cognition Arousal/Alertness: Awake/alert Behavior During Therapy: WFL for tasks assessed/performed Overall Cognitive Status: Within Functional Limits for tasks assessed                      Exercises Total Joint Exercises Ankle Circles/Pumps: AROM;Both;10 reps Quad Sets: AROM;Strengthening;Both;10 reps Heel Slides: AAROM;Left;10 reps    General Comments        Pertinent Vitals/Pain Pain Assessment: 0-10 Pain Score: 4  Pain Location: L knee Pain Descriptors / Indicators: Aching;Guarding;Grimacing Pain Intervention(s): Limited activity within patient's tolerance;Monitored during session;Premedicated before session;Repositioned    Home Living                      Prior Function            PT Goals (current goals can now be found in the care plan section) Acute Rehab PT Goals PT Goal Formulation: With patient Time For Goal Achievement: 04/04/16 Potential to Achieve Goals: Good Progress towards PT goals: Progressing toward goals    Frequency    7X/week      PT Plan Current plan remains appropriate    Co-evaluation             End of Session Equipment Utilized During Treatment: Gait belt;Left knee immobilizer Activity Tolerance: Patient tolerated treatment well Patient left: in chair;with call bell/phone within reach;with chair alarm set  Time: [id number] PT Time Calculation (min) (ACUTE ONLY): 25 min  Charges:  $Gait Training: 23-37 mins                    G Codes:      Joseph Hood 04/06/16, 10:36 AM

## 2016-03-31 NOTE — Progress Notes (Signed)
Occupational Therapy Treatment Patient Details Name: SHADEED BOARDWINE MRN: [id number] DOB: 02/09/1941 Today's Date: 03/31/2016    History of present illness s/p L TKA; PMHx: resting tremors   OT comments  Pt's tremors much worse today.  Only got up to chair.  Reviewed KI use, knee precautions.  He is not ready for shower transfer, but will practice toilet with use of just walker next session  Follow Up Recommendations  Supervision/Assistance - 24 hour    Equipment Recommendations  None recommended by OT    Recommendations for Other Services      Precautions / Restrictions Precautions Precautions: Fall;Knee Required Braces or Orthoses: Knee Immobilizer - Left Restrictions Other Position/Activity Restrictions: WBAT       Mobility Bed Mobility         Supine to sit: Min assist     General bed mobility comments: assist with LLE, incr time  Transfers   Equipment used: Rolling walker (2 wheeled)   Sit to Stand: Min assist;From elevated surface Stand pivot transfers: Min assist       General transfer comment: cues for UE/LE placement and assist to rise and stabilzie    Balance                                   ADL                           Toilet Transfer: Minimal assistance;Stand-pivot;RW             General ADL Comments: Pt used bathroom earlier:  wanted to practice getting up from commode without grab bar. Pt has a standardm commode, and our is comfort height. He does not want DME.  Pt was shaking throughout:  only performed SPT to chair.  Talked to him about shower transfer. Recommend he sponge bathes initially.  He does not have a seat (gave his deceased wife's away). Feel he should do a seated shower at this time.  HHPT can practice shower transfer when pt is ready--too unsteady at this time.  Pt nervous that he won't remember everything, like KI, hand placement.  Will review with family      Vision                      Perception     Praxis      Cognition   Behavior During Therapy: WFL for tasks assessed/performed Overall Cognitive Status: Within Functional Limits for tasks assessed                       Extremity/Trunk Assessment               Exercises     Shoulder Instructions       General Comments      Pertinent Vitals/ Pain       Pain Score: 7  Pain Location: L knee Pain Descriptors / Indicators: Aching Pain Intervention(s): Limited activity within patient's tolerance;Monitored during session;Premedicated before session;Repositioned;Ice applied  Home Living                                          Prior Functioning/Environment              Frequency  Progress Toward Goals  OT Goals(current goals can now be found in the care plan section)  Progress towards OT goals: Progressing toward goals     Plan      Co-evaluation                 End of Session     Activity Tolerance Patient limited by pain   Patient Left in chair;with call bell/phone within reach;with chair alarm set   Nurse Communication          Time: [phone] OT Time Calculation (min): 24 min  Charges: OT General Charges $OT Visit: 1 Procedure OT Treatments $Therapeutic Activity: 23-37 mins  Amma Crear 03/31/2016, 10:27 AM  Lesle Chris, OTR/L [phone] 03/31/2016

## 2016-03-31 NOTE — Progress Notes (Signed)
Physical Therapy Treatment Patient Details Name: Joseph Hood MRN: [id number] DOB: March 14, 1941 Today's Date: 03/31/2016    History of Present Illness s/p L TKA; PMHx: resting tremors    PT Comments    Pt continues to progress slowly;  He is unsteady with gait and only able to amb 25' before needing sitting rest/chair to pt for safety and fall prevention; see gait notes for VS; pt dtr is primary caregiver (has not arrived yet) and she will need to be here for family educ;    Follow Up Recommendations  Home health PT;Supervision for mobility/OOB     Equipment Recommendations  Rolling walker with 5" wheels    Recommendations for Other Services       Precautions / Restrictions Precautions Precautions: Fall;Knee Required Braces or Orthoses: Knee Immobilizer - Left Restrictions Other Position/Activity Restrictions: WBAT    Mobility  Bed Mobility Overal bed mobility: Needs Assistance Bed Mobility: Supine to Sit     Supine to sit: Min assist     General bed mobility comments: assist with LLE, incr time  Transfers Overall transfer level: Needs assistance Equipment used: Rolling walker (2 wheeled) Transfers: Sit to/from Stand Sit to Stand: Min assist;Min guard         General transfer comment: cues for UE/LE placement and assist to rise and stabilzie  Ambulation/Gait Ambulation/Gait assistance: Min assist;Min guard Ambulation Distance (Feet): 25 Feet Assistive device: Rolling walker (2 wheeled) Gait Pattern/deviations: Step-to pattern;Trunk flexed     General Gait Details: cues for posture, sequence, fatigues quickly--chair to pt for sitting rest/pt unable to continue amb safely; BP 164/72, sats 90-93%, HR 93   Stairs            Wheelchair Mobility    Modified Rankin (Stroke Patients Only)       Balance                                    Cognition Arousal/Alertness: Awake/alert Behavior During Therapy: WFL for tasks  assessed/performed Overall Cognitive Status: Within Functional Limits for tasks assessed                      Exercises Total Joint Exercises Quad Sets: AROM;Strengthening;Both;10 reps Heel Slides: AAROM;Left;10 reps Hip ABduction/ADduction: AAROM;Left;10 reps Straight Leg Raises: AAROM;Left;10 reps Goniometric ROM: grossly -10 to 68*    General Comments        Pertinent Vitals/Pain Pain Assessment: 0-10 Pain Score: 4  Pain Location: L knee Pain Descriptors / Indicators: Aching;Grimacing;Guarding;Sore Pain Intervention(s): Limited activity within patient's tolerance;Monitored during session;Ice applied;Repositioned    Home Living                      Prior Function            PT Goals (current goals can now be found in the care plan section) Acute Rehab PT Goals Patient Stated Goal: return to independence PT Goal Formulation: With patient Time For Goal Achievement: 04/04/16 Potential to Achieve Goals: Good Progress towards PT goals: Progressing toward goals    Frequency    7X/week      PT Plan Current plan remains appropriate    Co-evaluation             End of Session Equipment Utilized During Treatment: Gait belt;Left knee immobilizer Activity Tolerance: Patient tolerated treatment well Patient left: in chair;with call bell/phone within reach;with chair alarm  set;with family/visitor present     Time: [id number] PT Time Calculation (min) (ACUTE ONLY): 26 min  Charges:  $Gait Training: 8-22 mins $Therapeutic Exercise: 8-22 mins                    G Codes:      Katianna Mcclenney 2016-04-29, 3:39 PM

## 2016-03-31 NOTE — Progress Notes (Signed)
Physical Therapy Treatment Patient Details Name: Joseph Hood MRN: [id number] DOB: 16-Jul-1940 Today's Date: 03/31/2016    History of Present Illness s/p L TKA; PMHx: resting tremors    PT Comments    The patient states that he has to get to the bathroom in a hurry. Somewhat impulsive. assisted to Bathroom, assist to rise from the toilet and steady assist upon standing. Continue PT.  Follow Up Recommendations  Home health PT;Supervision for mobility/OOB     Equipment Recommendations  Rolling walker with 5" wheels    Recommendations for Other Services       Precautions / Restrictions Precautions Precautions: Fall;Knee Required Braces or Orthoses: Knee Immobilizer - Left Restrictions Other Position/Activity Restrictions: WBAT    Mobility  Bed Mobility Overal bed mobility: Needs Assistance Bed Mobility: Supine to Sit     Supine to sit: Min assist     General bed mobility comments: assist with LLE, incr time  Transfers Overall transfer level: Needs assistance Equipment used: Rolling walker (2 wheeled) Transfers: Sit to/from Stand Sit to Stand: Min assist;Min guard         General transfer comment: cues for UE/LE placement and assist to rise and stabilze. More steady assist to rise from the  toilet.  Ambulation/Gait Ambulation/Gait assistance: Min assist Ambulation Distance (Feet): 10 Feet (x 2) Assistive device: Rolling walker (2 wheeled) Gait Pattern/deviations: Step-to pattern     General Gait Details: cues for posture, sequence, fatigues quickly- multimodal cues for sequence.   Stairs            Wheelchair Mobility    Modified Rankin (Stroke Patients Only)       Balance Overall balance assessment: Needs assistance         Standing balance support: During functional activity;Bilateral upper extremity supported Standing balance-Leahy Scale: Poor                      Cognition Arousal/Alertness: Awake/alert Behavior During  Therapy: WFL for tasks assessed/performed Overall Cognitive Status: Within Functional Limits for tasks assessed                      Exercises Total Joint Exercises Quad Sets: AROM;Strengthening;Both;10 reps Heel Slides: AAROM;Left;10 reps Hip ABduction/ADduction: AAROM;Left;10 reps Straight Leg Raises: AAROM;Left;10 reps Goniometric ROM: grossly -10 to 68*    General Comments        Pertinent Vitals/Pain Pain Assessment: 0-10 Pain Score: 4  Pain Location: L knee Pain Descriptors / Indicators: Discomfort Pain Intervention(s): Premedicated before session;Monitored during session;Limited activity within patient's tolerance    Home Living                      Prior Function            PT Goals (current goals can now be found in the care plan section) Acute Rehab PT Goals Patient Stated Goal: return to independence PT Goal Formulation: With patient Time For Goal Achievement: 04/04/16 Potential to Achieve Goals: Good Progress towards PT goals: Progressing toward goals    Frequency    7X/week      PT Plan Current plan remains appropriate    Co-evaluation             End of Session Equipment Utilized During Treatment: Gait belt;Left knee immobilizer Activity Tolerance: Patient tolerated treatment well Patient left: in chair;with call bell/phone within reach;with chair alarm set;with family/visitor present     Time: 1400-1415 PT Time Calculation (  min) (ACUTE ONLY): 15 min  Charges:  $Gait Training: 8-22 mins $Therapeutic Exercise: 8-22 mins                    G Codes:      Claretha Cooper 03/31/2016, 4:02 PM Tresa Endo PT [phone]

## 2016-04-01 LAB — CBC
HEMATOCRIT: 32.5 % — AB (ref 39.0–52.0)
Hemoglobin: 11 g/dL — ABNORMAL LOW (ref 13.0–17.0)
MCH: 31.3 pg (ref 26.0–34.0)
MCHC: 33.8 g/dL (ref 30.0–36.0)
MCV: 92.3 fL (ref 78.0–100.0)
Platelets: 132 10*3/uL — ABNORMAL LOW (ref 150–400)
RBC: 3.52 MIL/uL — ABNORMAL LOW (ref 4.22–5.81)
RDW: 14.5 % (ref 11.5–15.5)
WBC: 11.9 10*3/uL — ABNORMAL HIGH (ref 4.0–10.5)

## 2016-04-01 NOTE — Progress Notes (Signed)
Subjective: 3 Days Post-Op Procedure(s) (LRB): LEFT TOTAL KNEE ARTHROPLASTY (Left) Patient reports pain as well controlled.  Progressing with PT. Tolerating PO's. Positive Flatus. Reports he is ready for D/c home with family. Denies CP, SOb, or calf pain.  Objective: Vital signs in last 24 hours: Temp:  [97.8 F (36.6 C)-99.4 F (37.4 C)] 98.6 F (37 C) (10/09 [id number]) Pulse Rate:  [80-93] 80 (10/09 0611) Resp:  [16-20] 16 (10/09 0611) BP: (116-160)/(68-77) 116/68 (10/09 0611) SpO2:  [90 %-92 %] 92 % (10/09 0611)  Intake/Output from previous day: 10/08 0701 - 10/09 0700 In: 720 [P.O.:720] Out: 650 [Urine:650] Intake/Output this shift: No intake/output data recorded.   Recent Labs  03/30/16 0452 03/31/16 0453 04/01/16 0400  HGB 12.8* 12.6* 11.0*    Recent Labs  03/31/16 0453 04/01/16 0400  WBC 13.6* 11.9*  RBC 4.03* 3.52*  HCT 37.5* 32.5*  PLT 144* 132*    Recent Labs  03/30/16 0452  NA 137  K 4.0  CL 105  CO2 23  BUN 16  CREATININE 0.90  GLUCOSE 119*  CALCIUM 8.4*   No results for input(s): LABPT, INR in the last 72 hours.  Alert and oriented x3. RRR, Lungs clear, BS x4. Left Calf soft and non tender. L knee dressing C/D/I. No DVT signs. No signs of infection or compartment syndrome. LLE grossly neurovascularly intact.   Assessment/Plan: 3 Days Post-Op Procedure(s) (LRB): LEFT TOTAL KNEE ARTHROPLASTY (Left) D/c home today Up with PT Follow instructions F/u in office in 2 weeks ASA  Ashanta Amoroso L 04/01/2016, 7:43 AM

## 2016-04-01 NOTE — Discharge Summary (Signed)
Physician Discharge Summary  Patient ID: Joseph Hood MRN: [id number] DOB/AGE: 1940-06-30 75 y.o.  Admit date: 03/29/2016 Discharge date: 04/01/2016  Admission Diagnoses: left knee OA  Discharge Diagnoses:  Active Problems:   Primary osteoarthritis of one knee, left   S/P knee replacement   Discharged Condition: good  Hospital Course:  Joseph Hood is a 75 y.o. who was admitted to [facility]. They were brought to the operating room on 03/29/2016 and underwent Procedure(s): LEFT TOTAL KNEE ARTHROPLASTY.  Patient tolerated the procedure well and was later transferred to the recovery room and then to the orthopaedic floor for postoperative care.  They were given PO and IV analgesics for pain control following their surgery.  They were given 24 hours of postoperative antibiotics of  Anti-infectives    Start     Dose/Rate Route Frequency Ordered Stop   03/29/16 2100  vancomycin (VANCOCIN) IVPB 1000 mg/200 mL premix     1,000 mg 200 mL/hr over 60 Minutes Intravenous Every 12 hours 03/29/16 1411 03/30/16 0009   03/29/16 0817  vancomycin (VANCOCIN) IVPB 1000 mg/200 mL premix     1,000 mg 200 mL/hr over 60 Minutes Intravenous On call to O.R. 03/29/16 [id number] 03/29/16 1014     and started on DVT prophylaxis in the form of lovenox.   PT and OT were ordered for total joint protocol.  Discharge planning consulted to help with postop disposition and equipment needs.  Patient had a good night on the evening of surgery and started to get up OOB with therapy on day one.  Hemovac drain was pulled without difficulty.  Continued to work with therapy into day two.  Dressing was with normal limits.  The patient had progressed with therapy and meeting their goals. Patient was seen in rounds and was ready to go home.  Consults: n/a  Significant Diagnostic Studies: routine  Treatments:routine  Discharge Exam: Blood pressure 116/68, pulse 80, temperature 98.6 F (37 C), temperature source  Oral, resp. rate 16, height 6' (1.829 m), weight 87.1 kg (192 lb), SpO2 92 %. Alert and oriented x3. RRR, Lungs clear, BS x4. Left Calf soft and non tender. L knee dressing C/D/I. No DVT signs. No signs of infection or compartment syndrome. LLE grossly neurovascularly intact.   Disposition:   Discharge Instructions    Call MD / Call 911    Complete by:  As directed    If you experience chest pain or shortness of breath, CALL 911 and be transported to the hospital emergency room.  If you develope a fever above 101 F, pus (white drainage) or increased drainage or redness at the wound, or calf pain, call your surgeon's office.   Constipation Prevention    Complete by:  As directed    Drink plenty of fluids.  Prune juice may be helpful.  You may use a stool softener, such as Colace (over the counter) 100 mg twice a day.  Use MiraLax (over the counter) for constipation as needed.   Diet - low sodium heart healthy    Complete by:  As directed    Discharge instructions    Complete by:  As directed    INSTRUCTIONS AFTER JOINT REPLACEMENT   Remove items at home which could result in a fall. This includes throw rugs or furniture in walking pathways ICE to the affected joint every three hours while awake for 30 minutes at a time, for at least the first 3-5 days, and then as needed for pain  and swelling.  Continue to use ice for pain and swelling. You may notice swelling that will progress down to the foot and ankle.  This is normal after surgery.  Elevate your leg when you are not up walking on it.   Continue to use the breathing machine you got in the hospital (incentive spirometer) which will help keep your temperature down.  It is common for your temperature to cycle up and down following surgery, especially at night when you are not up moving around and exerting yourself.  The breathing machine keeps your lungs expanded and your temperature down.   DIET:  As you were doing prior to hospitalization,  we recommend a well-balanced diet.  DRESSING / WOUND CARE / SHOWERING  Keep the surgical dressing until follow up.  The dressing is water proof, so you can shower without any extra covering.  IF THE DRESSING FALLS OFF or the wound gets wet inside, change the dressing with sterile gauze.  Please use good hand washing techniques before changing the dressing.  Do not use any lotions or creams on the incision until instructed by your surgeon.    ACTIVITY  Increase activity slowly as tolerated, but follow the weight bearing instructions below.   No driving for 6 weeks or until further direction given by your physician.  You cannot drive while taking narcotics.  No lifting or carrying greater than 10 lbs. until further directed by your surgeon. Avoid periods of inactivity such as sitting longer than an hour when not asleep. This helps prevent blood clots.  You may return to work once you are authorized by your doctor.     WEIGHT BEARING   Weight bearing as tolerated with assist device (walker, cane, etc) as directed, use it as long as suggested by your surgeon or therapist, typically at least 4-6 weeks.   EXERCISES  Results after joint replacement surgery are often greatly improved when you follow the exercise, range of motion and muscle strengthening exercises prescribed by your doctor. Safety measures are also important to protect the joint from further injury. Any time any of these exercises cause you to have increased pain or swelling, decrease what you are doing until you are comfortable again and then slowly increase them. If you have problems or questions, call your caregiver or physical therapist for advice.   Rehabilitation is important following a joint replacement. After just a few days of immobilization, the muscles of the leg can become weakened and shrink (atrophy).  These exercises are designed to build up the tone and strength of the thigh and leg muscles and to improve motion.  Often times heat used for twenty to thirty minutes before working out will loosen up your tissues and help with improving the range of motion but do not use heat for the first two weeks following surgery (sometimes heat can increase post-operative swelling).   These exercises can be done on a training (exercise) mat, on the floor, on a table or on a bed. Use whatever works the best and is most comfortable for you.    Use music or television while you are exercising so that the exercises are a pleasant break in your day. This will make your life better with the exercises acting as a break in your routine that you can look forward to.   Perform all exercises about fifteen times, three times per day or as directed.  You should exercise both the operative leg and the other leg as well.  Exercises include:   Quad Sets - Tighten up the muscle on the front of the thigh (Quad) and hold for 5-10 seconds.   Straight Leg Raises - With your knee straight (if you were given a brace, keep it on), lift the leg to 60 degrees, hold for 3 seconds, and slowly lower the leg.  Perform this exercise against resistance later as your leg gets stronger.  Leg Slides: Lying on your back, slowly slide your foot toward your buttocks, bending your knee up off the floor (only go as far as is comfortable). Then slowly slide your foot back down until your leg is flat on the floor again.  Angel Wings: Lying on your back spread your legs to the side as far apart as you can without causing discomfort.  Hamstring Strength:  Lying on your back, push your heel against the floor with your leg straight by tightening up the muscles of your buttocks.  Repeat, but this time bend your knee to a comfortable angle, and push your heel against the floor.  You may put a pillow under the heel to make it more comfortable if necessary.   A rehabilitation program following joint replacement surgery can speed recovery and prevent re-injury in the future due  to weakened muscles. Contact your doctor or a physical therapist for more information on knee rehabilitation.    CONSTIPATION  Constipation is defined medically as fewer than three stools per week and severe constipation as less than one stool per week.  Even if you have a regular bowel pattern at home, your normal regimen is likely to be disrupted due to multiple reasons following surgery.  Combination of anesthesia, postoperative narcotics, change in appetite and fluid intake all can affect your bowels.   YOU MUST use at least one of the following options; they are listed in order of increasing strength to get the job done.  They are all available over the counter, and you may need to use some, POSSIBLY even all of these options:    Drink plenty of fluids (prune juice may be helpful) and high fiber foods Colace 100 mg by mouth twice a day  Senokot for constipation as directed and as needed Dulcolax (bisacodyl), take with full glass of water  Miralax (polyethylene glycol) once or twice a day as needed.  If you have tried all these things and are unable to have a bowel movement in the first 3-4 days after surgery call either your surgeon or your primary doctor.    If you experience loose stools or diarrhea, hold the medications until you stool forms back up.  If your symptoms do not get better within 1 week or if they get worse, check with your doctor.  If you experience "the worst abdominal pain ever" or develop nausea or vomiting, please contact the office immediately for further recommendations for treatment.   ITCHING:  If you experience itching with your medications, try taking only a single pain pill, or even half a pain pill at a time.  You can also use Benadryl over the counter for itching or also to help with sleep.   TED HOSE STOCKINGS:  Use stockings on both legs until for at least 2 weeks or as directed by physician office. They may be removed at night for sleeping.  MEDICATIONS:   See your medication summary on the "After Visit Summary" that nursing will review with you.  You may have some home medications which will be placed on hold until  you complete the course of blood thinner medication.  It is important for you to complete the blood thinner medication as prescribed.  PRECAUTIONS:  If you experience chest pain or shortness of breath - call 911 immediately for transfer to the hospital emergency department.   If you develop a fever greater that 101 F, purulent drainage from wound, increased redness or drainage from wound, foul odor from the wound/dressing, or calf pain - CONTACT YOUR SURGEON.                                                   FOLLOW-UP APPOINTMENTS:  If you do not already have a post-op appointment, please call the office for an appointment to be seen by your surgeon.  Guidelines for how soon to be seen are listed in your "After Visit Summary", but are typically between 1-4 weeks after surgery.  OTHER INSTRUCTIONS:   Knee Replacement:  Do not place pillow under knee, focus on keeping the knee straight while resting. CPM instructions: 0-90 degrees, 2 hours in the morning, 2 hours in the afternoon, and 2 hours in the evening. Place foam block, curve side up under heel at all times except when in CPM or when walking.  DO NOT modify, tear, cut, or change the foam block in any way.  MAKE SURE YOU:  Understand these instructions.  Get help right away if you are not doing well or get worse.    Thank you for letting us be a part of your medical care team.  It is a privilege we respect greatly.  We hope these instructions will help you stay on track for a fast and full recovery!   Increase activity slowly as tolerated    Complete by:  As directed        Medication List    STOP taking these medications   aspirin 81 MG tablet Replaced by:  aspirin EC 325 MG tablet   ibuprofen 200 MG tablet Commonly known as:  ADVIL,MOTRIN     TAKE these medications    aspirin EC 325 MG tablet Take 1 tablet (325 mg total) by mouth 2 (two) times daily. Replaces:  aspirin 81 MG tablet   baclofen 10 MG tablet Commonly known as:  LIORESAL Take 1 tablet (10 mg total) by mouth 3 (three) times daily as needed for muscle spasms.   multivitamin tablet Take 1 tablet by mouth daily.   oxyCODONE 5 MG immediate release tablet Commonly known as:  Oxy IR/ROXICODONE Take 1-2 tablets (5-10 mg total) by mouth every 4 (four) hours as needed for severe pain.   primidone 250 MG tablet Commonly known as:  MYSOLINE Take 1 tablet (250 mg total) by mouth 2 (two) times daily.   sertraline 50 MG tablet Commonly known as:  ZOLOFT Take 25 mg by mouth daily.   simvastatin 20 MG tablet Commonly known as:  ZOCOR Take 20 mg by mouth at bedtime.   TYLENOL 8 HOUR ARTHRITIS PAIN 650 MG CR tablet Generic drug:  acetaminophen Take 650 mg by mouth every 8 (eight) hours as needed for pain.      Follow-up [place] .   Why:  rolling walker Contact information: [place] [address] [phone]        [facility] .  Why:  now known as [facility]; you will receive a call from this agency to schedule your at home physical therapy Contact information: [address] [place] [address] [phone]        Cynda Familia, MD. Schedule an appointment as soon as possible for a visit in 2 week(s).   Specialty:  Orthopedic Surgery Why:  Call office to make appointment. Contact information: [address] [place] [address] [phone]           Signed: Lajean Manes 04/01/2016, 7:42 AM

## 2016-04-01 NOTE — Progress Notes (Signed)
Physical Therapy Treatment Patient Details Name: Joseph Hood MRN: [id number] DOB: 1940/10/08 Today's Date: 04/01/2016    History of Present Illness s/p L TKA; PMHx: resting tremors    PT Comments    POD # 3 Assisted with amb in hallway and performing all supine TKR TE's following HEP handout.  Addressed many mobility questions and educated on proper L LE positioning esp since pt lacks full extension.  Addressed use of ICE.  Addressed use of wearing KI at night for side sleeping.    Follow Up Recommendations        Equipment Recommendations       Recommendations for Other Services       Precautions / Restrictions Precautions Precautions: Fall;Knee Precaution Comments: instructed to wear KI for side sleeping Restrictions Weight Bearing Restrictions: No Other Position/Activity Restrictions: WBAT    Mobility  Bed Mobility Overal bed mobility: Needs Assistance Bed Mobility: Supine to Sit     Supine to sit: Min guard     General bed mobility comments: assist with LLE, incr time  Transfers Overall transfer level: Needs assistance Equipment used: Rolling walker (2 wheeled) Transfers: Sit to/from Stand Sit to Stand: Supervision;Min guard         General transfer comment: cues for UE/LE placement and assist to rise and stabilze. More steady assist to rise from the  toilet.  Ambulation/Gait Ambulation/Gait assistance: Supervision;Min guard Ambulation Distance (Feet): 45 Feet Assistive device: Rolling walker (2 wheeled) Gait Pattern/deviations: Step-to pattern;Decreased stance time - left Gait velocity: decreased   General Gait Details: cues for posture, sequence, fatigues quickly- multimodal cues for sequence.   Stairs            Wheelchair Mobility    Modified Rankin (Stroke Patients Only)       Balance                                    Cognition Arousal/Alertness: Awake/alert Behavior During Therapy: WFL for tasks  assessed/performed Overall Cognitive Status: Within Functional Limits for tasks assessed                      Exercises   Total Knee Replacement TE's 10 reps B LE ankle pumps 10 reps towel squeezes 10 reps knee presses 10 reps heel slides  10 reps SAQ's 10 reps SLR's 10 reps ABD Followed by ICE     General Comments        Pertinent Vitals/Pain Pain Assessment: 0-10 Pain Score: 5  Pain Location: L knee Pain Descriptors / Indicators: Discomfort;Grimacing;Sore;Tender Pain Intervention(s): Monitored during session;Repositioned;Ice applied    Home Living                      Prior Function            PT Goals (current goals can now be found in the care plan section) Progress towards PT goals: Progressing toward goals    Frequency    7X/week      PT Plan Current plan remains appropriate    Co-evaluation             End of Session Equipment Utilized During Treatment: Gait belt Activity Tolerance: Patient tolerated treatment well Patient left: in chair;with call bell/phone within reach;with chair alarm set;with family/visitor present     Time: 1005-1047 PT Time Calculation (min) (ACUTE ONLY): 42 min  Charges:  $Gait Training:  8-22 mins $Therapeutic Exercise: 8-22 mins $Therapeutic Activity: 8-22 mins                    G Codes:      Rica Koyanagi  PTA [facility]  Acute  Rehab Pager      [phone]

## 2016-04-01 NOTE — Care Management Note (Signed)
Case Management Note  Patient Details  Name: Joseph Hood MRN: [id number] Date of Birth: [birth year]  Subjective/Objective: Ordered for 3n1, shower stool-[facility] dme rep Jermaine aware todeliver to rm prior d/c today. Patient already has rw in rm.  [facility] already aware of d/c-[facility]. No further CM needs.                 Action/Plan:d/c home w/HHC/DME  Expected Discharge Date:                  Expected Discharge Plan:  [place]  In-House Referral:     Discharge planning Services  CM Consult  Post Acute Care Choice:  Home Health Choice offered to:  Patient  DME Arranged:  Walker rolling, 3-N-1, Shower stool DME Agency:  [facility] [employer]  [place]:  PT [place]:  [facility] [employer]  Status of Service:  Completed, signed off  If discussed at [employer] [facility] [employer], dates discussed:    Additional Comments:  Dessa Phi, RN 04/01/2016, 11:37 AM

## 2016-04-02 DIAGNOSIS — I10 Essential (primary) hypertension: Secondary | ICD-10-CM | POA: Diagnosis not present

## 2016-04-02 DIAGNOSIS — Z87891 Personal history of nicotine dependence: Secondary | ICD-10-CM | POA: Diagnosis not present

## 2016-04-02 DIAGNOSIS — I2510 Atherosclerotic heart disease of native coronary artery without angina pectoris: Secondary | ICD-10-CM | POA: Diagnosis not present

## 2016-04-02 DIAGNOSIS — Z7982 Long term (current) use of aspirin: Secondary | ICD-10-CM | POA: Diagnosis not present

## 2016-04-02 DIAGNOSIS — Z86718 Personal history of other venous thrombosis and embolism: Secondary | ICD-10-CM | POA: Diagnosis not present

## 2016-04-02 DIAGNOSIS — Z79891 Long term (current) use of opiate analgesic: Secondary | ICD-10-CM | POA: Diagnosis not present

## 2016-04-02 DIAGNOSIS — G250 Essential tremor: Secondary | ICD-10-CM | POA: Diagnosis not present

## 2016-04-02 DIAGNOSIS — E785 Hyperlipidemia, unspecified: Secondary | ICD-10-CM | POA: Diagnosis not present

## 2016-04-02 DIAGNOSIS — I452 Bifascicular block: Secondary | ICD-10-CM | POA: Diagnosis not present

## 2016-04-02 DIAGNOSIS — Z96652 Presence of left artificial knee joint: Secondary | ICD-10-CM | POA: Diagnosis not present

## 2016-04-02 DIAGNOSIS — Z471 Aftercare following joint replacement surgery: Secondary | ICD-10-CM | POA: Diagnosis not present

## 2016-04-02 DIAGNOSIS — F418 Other specified anxiety disorders: Secondary | ICD-10-CM | POA: Diagnosis not present

## 2016-04-08 DIAGNOSIS — I452 Bifascicular block: Secondary | ICD-10-CM | POA: Diagnosis not present

## 2016-04-08 DIAGNOSIS — Z87891 Personal history of nicotine dependence: Secondary | ICD-10-CM | POA: Diagnosis not present

## 2016-04-08 DIAGNOSIS — I2510 Atherosclerotic heart disease of native coronary artery without angina pectoris: Secondary | ICD-10-CM | POA: Diagnosis not present

## 2016-04-08 DIAGNOSIS — F418 Other specified anxiety disorders: Secondary | ICD-10-CM | POA: Diagnosis not present

## 2016-04-08 DIAGNOSIS — I10 Essential (primary) hypertension: Secondary | ICD-10-CM | POA: Diagnosis not present

## 2016-04-08 DIAGNOSIS — G250 Essential tremor: Secondary | ICD-10-CM | POA: Diagnosis not present

## 2016-04-08 DIAGNOSIS — Z7982 Long term (current) use of aspirin: Secondary | ICD-10-CM | POA: Diagnosis not present

## 2016-04-08 DIAGNOSIS — Z96652 Presence of left artificial knee joint: Secondary | ICD-10-CM | POA: Diagnosis not present

## 2016-04-08 DIAGNOSIS — E785 Hyperlipidemia, unspecified: Secondary | ICD-10-CM | POA: Diagnosis not present

## 2016-04-08 DIAGNOSIS — Z79891 Long term (current) use of opiate analgesic: Secondary | ICD-10-CM | POA: Diagnosis not present

## 2016-04-08 DIAGNOSIS — Z471 Aftercare following joint replacement surgery: Secondary | ICD-10-CM | POA: Diagnosis not present

## 2016-04-08 DIAGNOSIS — Z86718 Personal history of other venous thrombosis and embolism: Secondary | ICD-10-CM | POA: Diagnosis not present

## 2016-04-12 DIAGNOSIS — Z96652 Presence of left artificial knee joint: Secondary | ICD-10-CM | POA: Diagnosis not present

## 2016-04-17 DIAGNOSIS — M25662 Stiffness of left knee, not elsewhere classified: Secondary | ICD-10-CM | POA: Diagnosis not present

## 2016-04-19 DIAGNOSIS — M25662 Stiffness of left knee, not elsewhere classified: Secondary | ICD-10-CM | POA: Diagnosis not present

## 2016-04-22 DIAGNOSIS — M25662 Stiffness of left knee, not elsewhere classified: Secondary | ICD-10-CM | POA: Diagnosis not present

## 2016-04-24 DIAGNOSIS — M25662 Stiffness of left knee, not elsewhere classified: Secondary | ICD-10-CM | POA: Diagnosis not present

## 2016-04-26 DIAGNOSIS — M25662 Stiffness of left knee, not elsewhere classified: Secondary | ICD-10-CM | POA: Diagnosis not present

## 2016-04-29 DIAGNOSIS — M25662 Stiffness of left knee, not elsewhere classified: Secondary | ICD-10-CM | POA: Diagnosis not present

## 2016-05-03 DIAGNOSIS — M25662 Stiffness of left knee, not elsewhere classified: Secondary | ICD-10-CM | POA: Diagnosis not present

## 2016-05-07 DIAGNOSIS — M25662 Stiffness of left knee, not elsewhere classified: Secondary | ICD-10-CM | POA: Diagnosis not present

## 2016-05-10 DIAGNOSIS — M25662 Stiffness of left knee, not elsewhere classified: Secondary | ICD-10-CM | POA: Diagnosis not present

## 2016-05-13 DIAGNOSIS — M25662 Stiffness of left knee, not elsewhere classified: Secondary | ICD-10-CM | POA: Diagnosis not present

## 2016-05-15 DIAGNOSIS — M25662 Stiffness of left knee, not elsewhere classified: Secondary | ICD-10-CM | POA: Diagnosis not present

## 2016-05-21 DIAGNOSIS — M25662 Stiffness of left knee, not elsewhere classified: Secondary | ICD-10-CM | POA: Diagnosis not present

## 2016-05-24 DIAGNOSIS — M25662 Stiffness of left knee, not elsewhere classified: Secondary | ICD-10-CM | POA: Diagnosis not present

## 2016-05-28 DIAGNOSIS — M25662 Stiffness of left knee, not elsewhere classified: Secondary | ICD-10-CM | POA: Diagnosis not present

## 2016-05-31 DIAGNOSIS — M25662 Stiffness of left knee, not elsewhere classified: Secondary | ICD-10-CM | POA: Diagnosis not present

## 2016-06-04 DIAGNOSIS — M25662 Stiffness of left knee, not elsewhere classified: Secondary | ICD-10-CM | POA: Diagnosis not present

## 2016-06-07 DIAGNOSIS — M25662 Stiffness of left knee, not elsewhere classified: Secondary | ICD-10-CM | POA: Diagnosis not present

## 2016-06-12 DIAGNOSIS — M25662 Stiffness of left knee, not elsewhere classified: Secondary | ICD-10-CM | POA: Diagnosis not present

## 2016-06-21 DIAGNOSIS — M25662 Stiffness of left knee, not elsewhere classified: Secondary | ICD-10-CM | POA: Diagnosis not present

## 2016-06-25 DIAGNOSIS — M25662 Stiffness of left knee, not elsewhere classified: Secondary | ICD-10-CM | POA: Diagnosis not present

## 2016-06-26 DIAGNOSIS — Z96652 Presence of left artificial knee joint: Secondary | ICD-10-CM | POA: Diagnosis not present

## 2016-06-27 DIAGNOSIS — M25662 Stiffness of left knee, not elsewhere classified: Secondary | ICD-10-CM | POA: Diagnosis not present

## 2016-07-03 DIAGNOSIS — M25662 Stiffness of left knee, not elsewhere classified: Secondary | ICD-10-CM | POA: Diagnosis not present

## 2016-07-05 DIAGNOSIS — M25662 Stiffness of left knee, not elsewhere classified: Secondary | ICD-10-CM | POA: Diagnosis not present

## 2016-07-09 DIAGNOSIS — M25662 Stiffness of left knee, not elsewhere classified: Secondary | ICD-10-CM | POA: Diagnosis not present

## 2016-07-12 DIAGNOSIS — M25662 Stiffness of left knee, not elsewhere classified: Secondary | ICD-10-CM | POA: Diagnosis not present

## 2016-07-15 DIAGNOSIS — M25662 Stiffness of left knee, not elsewhere classified: Secondary | ICD-10-CM | POA: Diagnosis not present

## 2016-07-17 DIAGNOSIS — M25662 Stiffness of left knee, not elsewhere classified: Secondary | ICD-10-CM | POA: Diagnosis not present

## 2016-07-23 DIAGNOSIS — M25662 Stiffness of left knee, not elsewhere classified: Secondary | ICD-10-CM | POA: Diagnosis not present

## 2016-07-26 DIAGNOSIS — M25662 Stiffness of left knee, not elsewhere classified: Secondary | ICD-10-CM | POA: Diagnosis not present

## 2016-08-08 DIAGNOSIS — L570 Actinic keratosis: Secondary | ICD-10-CM | POA: Diagnosis not present

## 2016-08-08 DIAGNOSIS — Z85828 Personal history of other malignant neoplasm of skin: Secondary | ICD-10-CM | POA: Diagnosis not present

## 2016-08-08 DIAGNOSIS — D2330 Other benign neoplasm of skin of unspecified part of face: Secondary | ICD-10-CM | POA: Diagnosis not present

## 2016-08-08 DIAGNOSIS — D229 Melanocytic nevi, unspecified: Secondary | ICD-10-CM | POA: Diagnosis not present

## 2016-08-12 DIAGNOSIS — H02845 Edema of left lower eyelid: Secondary | ICD-10-CM | POA: Diagnosis not present

## 2016-09-13 DIAGNOSIS — Z0000 Encounter for general adult medical examination without abnormal findings: Secondary | ICD-10-CM | POA: Diagnosis not present

## 2016-09-13 DIAGNOSIS — Z125 Encounter for screening for malignant neoplasm of prostate: Secondary | ICD-10-CM | POA: Diagnosis not present

## 2016-09-13 DIAGNOSIS — Z7982 Long term (current) use of aspirin: Secondary | ICD-10-CM | POA: Diagnosis not present

## 2016-09-13 DIAGNOSIS — E7800 Pure hypercholesterolemia, unspecified: Secondary | ICD-10-CM | POA: Diagnosis not present

## 2016-09-18 DIAGNOSIS — I2510 Atherosclerotic heart disease of native coronary artery without angina pectoris: Secondary | ICD-10-CM | POA: Diagnosis not present

## 2016-09-18 DIAGNOSIS — E785 Hyperlipidemia, unspecified: Secondary | ICD-10-CM | POA: Diagnosis not present

## 2016-09-18 DIAGNOSIS — I8390 Asymptomatic varicose veins of unspecified lower extremity: Secondary | ICD-10-CM | POA: Diagnosis not present

## 2016-09-18 DIAGNOSIS — Z0001 Encounter for general adult medical examination with abnormal findings: Secondary | ICD-10-CM | POA: Diagnosis not present

## 2016-09-30 DIAGNOSIS — Z96652 Presence of left artificial knee joint: Secondary | ICD-10-CM | POA: Diagnosis not present

## 2016-10-03 NOTE — Progress Notes (Addendum)
;Subjective:    Joseph Hood was seen in consultation in the movement disorder clinic at the request of Joseph Pel, MD.  The evaluation is for tremor.  The patient has previously seen Dr. Rexene Hood as well as Dr. Erling Hood.  The patient was last seen by Dr. Rexene Hood in July, 2014.  I had the opportunity to review those notes.  The pt is R hand dominant.  Pt states that his father and paternal grandfather had tremor as well and his sister has tremor.  He first noted tremor in his 77's.  The L hand shakes worse than the R.  In the past, the patient has tried propranolol, but that did not seem to help.  I do not know the dose that the patient tried.  The patient did not think that primidone, 250 mg twice a day helped, but according to notes it was attempted to discontinue the medication and tremor got worse and the medication was reinitiated.  Dr. Rexene Hood did talk to the patient about DBS therapy and offered consults at [facility].  The patient does have a history of nephrolithiasis.  03/29/14 update:  Pt returns today for follow up.  States that he has thought about his options and he doesn't want DBS.  He was dx with anterior uveitis since last visit and a f/u visit yesterday. He was told that he had swelling of the retina.   He is being referred to a specialist for it and wants to wait until that gets cleared up before he takes new medication.  He has trouble putting the eye drops in because of the tremor.  He remains on primidone 250 mg bid and is still drinking miller light 3-5 cans per day but states that some days he doesn't drink at all.    03/28/15 update:  Pt returns for follow up.  He was last seen a year ago.  He is on primidone 250 mg bid.  We discussed trying gabapentin and he was to call me back about this but never did; would like to try now.  States that he had another episode of uveitis in the spring time.  He still has significant tremor, left more than right.  States that he has cut  back on his zoloft from 50 mg to 25 mg as was told that this could contribute to tremor.   02/07/16 update:  The patient returns today for follow-up, still on primidone, 250 mg twice a day. States that if he drinks wine, the tremor virtually stops.   Last visit, he was interested in starting low-dose gabapentin, 300 mg twice a day.  He called me back not long thereafter and stated that it caused worsening of tremor, dizziness and cognitive change.  He wanted to go off the medication.  He did not want to try another medication such as Lyrica.  The pharmacy did call at one point and stated that the patient had told them he was on 500 mg of primidone twice a day, but that was never in pharmacy records, nor was it in our records.  Pt states he is going to have R TKA in October.  2 weeks ago he had cataract surgery on the right.  Last week, he had a basal cell removed on the face.  He still has stitches.  10/08/16 update:  Pt seen in follow up.  Patient is on primidone, 250 mg twice per day.  The patient continues to have tremor.  He has not  wanted to pursue surgical options.  Pt states that he has had a PE last week and he was told to ask if zoloft, 25 mg, contributed to tremor as Dr. Erling Hood told him it did.  He had a left total knee arthroplasty in October.  I did review those records.  He c/o L knee swelling.  2 knee surgeries on that last year and has a f/u appt on that last year.  He is no longer drinking EtOH (12 beers in 7 months per pt) but he has changed out this addiction for an "ice cream addiction."  Current/Previously tried tremor medications: Propranolol, unknown dose; primidone; no Topamax due to history of nephrolithiasis x2; gabapentin - 300 mg twice a day caused dizziness  Current medications that may exacerbate tremor:  None  Outside reports reviewed: historical medical records, lab reports and referral letter/letters.  Allergies  Allergen Reactions  . Augmentin [Amoxicillin-Pot Clavulanate]  Other (See Comments)    Unknown - Older allergies  . Tetracyclines & Related Other (See Comments)    Unknown- Older allergies    Current Outpatient Prescriptions on File Prior to Visit  Medication Sig Dispense Refill  . Multiple Vitamin (MULTIVITAMIN) tablet Take 1 tablet by mouth daily.      . sertraline (ZOLOFT) 50 MG tablet Take 25 mg by mouth daily.     . simvastatin (ZOCOR) 20 MG tablet Take 20 mg by mouth at bedtime.      No current facility-administered medications on file prior to visit.     Past Medical History:  Diagnosis Date  . Angina   . Anxiety   . BPH (benign prostatic hyperplasia)   . Coronary artery disease   . Diverticulosis   . DVT (deep venous thrombosis) (HCC) 1990   LLE x2-  left calf is slight larger than right  . Essential tremor   . Heart disease   . History of kidney stones   . Hypercholesterolemia   . RBBB (right bundle branch block with left anterior fascicular block)   . Uveitis    bilateral  . Varicose veins of left lower extremity with pain     Past Surgical History:  Procedure Laterality Date  . APPENDECTOMY  1951  . CARDIAC CATHETERIZATION  1997  . CATARACT EXTRACTION Right 08/2013  . CORONARY ANGIOPLASTY  1997   stent   . INGUINAL HERNIA REPAIR Bilateral   . LITHOTRIPSY    . NOSE SURGERY  01/29/2016   MOHS surgery  . TOTAL KNEE ARTHROPLASTY Left 03/29/2016   Procedure: LEFT TOTAL KNEE ARTHROPLASTY;  Surgeon: Sydnee Cabal, MD;  Location: [facility];  Service: Orthopedics;  Laterality: Left;  . UMBILICAL HERNIA REPAIR      Social History   Social History  . Marital status: Widowed    Spouse name: N/A  . Number of children: N/A  . Years of education: N/A   Occupational History  . retired Retired   Social History Main Topics  . Smoking status: Former Smoker    Packs/day: 2.00    Years: 35.00    Types: Cigarettes    Quit date: 06/29/1991  . Smokeless tobacco: Never Used  . Alcohol use Yes     Comment: 2-3 beers daily  . Drug  use: No  . Sexual activity: Not on file   Other Topics Concern  . Not on file   Social History Narrative   The patient is a widower with 1 year of college.His wife had a hemorrhagic stroke, but did  well for 3 years  and then had metastatic disease to her brain and  died 26/10.  He has 2 children. He is retired and lives at home. He used to smoke 2 packs of cigarettes a day but quit 07/09/1992.  He drinks 3-4 beers a day and 1 diet coke.    Family Status  Relation Status  . Mother Deceased   heart disease  . Father Deceased   heart disease, EtOHism  . Daughter Alive   healthy  . Son Alive   healthy  . Sister Alive   tremor, breast CA    Review of Systems A complete 10 system ROS was obtained and was negative apart from what is mentioned.   Objective:   VITALS:   Vitals:   10/08/16 1058  BP: 128/78  Pulse: 74  Weight: 191 lb (86.6 kg)  Height: 5' 11.5" (1.816 m)   Gen:  Appears stated age and in NAD. HEENT:  Normocephalic.  He has healing wounds on the face due to basal cell on the right.   The mucous membranes are moist. The superficial temporal arteries are without ropiness or tenderness. Cardiovascular: Regular rate and rhythm. Lungs: Clear to auscultation bilaterally. Neck: There are no carotid bruits noted bilaterally.  NEUROLOGICAL:  Orientation:  The patient is alert and oriented x 3.  Recent and remote memory are intact.   Cranial nerves: There is good facial symmetry. Extraocular muscles are intact and visual fields are full to confrontational testing. Speech is fluent and clear. Soft palate rises symmetrically and there is no tongue deviation. Hearing is intact to conversational tone. Tone: Tone is good throughout. Sensation: Sensation is intact to light touch throughout. Coordination:  The patient has no dysdiadichokinesia or dysmetria. Motor: Strength is 5/5 in the bilateral upper and lower extremities.  Shoulder shrug is equal bilaterally.  There is no  pronator drift.  There are no fasciculations noted. Gait and Station: The patient is able to ambulate without difficulty.   MOVEMENT EXAM: Tremor:  There is tremor in the UE, noted most significantly with action.   The patients tremor is moderate to severe.  He has tremor in the arms and legs and the L is worse than the right.   It is not worse with a weight in the arm.  He has trouble archimedes spirals.    LABS:  Received lab work from his primary care physician dated 09/13/2016.  White blood cells were 6.6, hemoglobin 16.1, hematocrit 47.6 and platelets 173.  Sodium was 141, potassium 4.3, chloride 103, CO2 25, BUN 12, creatinine 0.88, AST 15, ALT 17, alkaline phosphatase 75     Assessment/Plan:   1.  Essential Tremor, moderately severe.  -This is evidenced by the symmetrical nature and longstanding hx of gradually getting worse.  I think it is unlikely that we will get significant control over tremor without DBS surgery, and we talked about this again today.  He has researched this and thought about it, and really is not interested in DBS surgery.  I would be nervous about a purely anticholinergic medication in his age group (Artane, Cogentin) but talked to him about these as he is unhappy with tremor control.  Talked to him about cognitive change risk and risk of fall.  He wants to think about it and I agree with that.    -told him that I don't think that zoloft, 25 mg is contributing to tremor (pt asked)  -will try to get labs from PCP 2.  peripheral neuropathy on examination  -Pt states that he has only had 12 beers in 7 months which is greatly improved for him.   3.  F/u 6-8 months, sooner should new neuro issues arise.  Much greater than 50% of this visit was spent in counseling with the patient and the family.  Total face to face time:  25 min.

## 2016-10-08 ENCOUNTER — Encounter: Payer: Self-pay | Admitting: Neurology

## 2016-10-08 ENCOUNTER — Ambulatory Visit (INDEPENDENT_AMBULATORY_CARE_PROVIDER_SITE_OTHER): Payer: PPO | Admitting: Neurology

## 2016-10-08 VITALS — BP 128/78 | HR 74 | Ht 71.5 in | Wt 191.0 lb

## 2016-10-08 DIAGNOSIS — G250 Essential tremor: Secondary | ICD-10-CM | POA: Diagnosis not present

## 2016-10-08 DIAGNOSIS — G609 Hereditary and idiopathic neuropathy, unspecified: Secondary | ICD-10-CM

## 2016-10-08 MED ORDER — PRIMIDONE 250 MG PO TABS: 250.0000 mg | ORAL_TABLET | Freq: Two times a day (BID) | ORAL | 2 refills | Status: DC

## 2016-10-08 NOTE — Patient Instructions (Signed)
You can look up artane (trihexyphenidyl).  It does have risks of cognitive change, dry eyes and balance change.  We would have to try very low dosages.

## 2016-10-14 DIAGNOSIS — M25572 Pain in left ankle and joints of left foot: Secondary | ICD-10-CM | POA: Diagnosis not present

## 2016-10-14 DIAGNOSIS — M25662 Stiffness of left knee, not elsewhere classified: Secondary | ICD-10-CM | POA: Diagnosis not present

## 2016-10-14 DIAGNOSIS — M792 Neuralgia and neuritis, unspecified: Secondary | ICD-10-CM | POA: Diagnosis not present

## 2016-10-14 DIAGNOSIS — Z96652 Presence of left artificial knee joint: Secondary | ICD-10-CM | POA: Diagnosis not present

## 2016-11-15 DIAGNOSIS — G8929 Other chronic pain: Secondary | ICD-10-CM | POA: Diagnosis not present

## 2016-11-15 DIAGNOSIS — M79672 Pain in left foot: Secondary | ICD-10-CM | POA: Diagnosis not present

## 2016-11-15 DIAGNOSIS — M216X2 Other acquired deformities of left foot: Secondary | ICD-10-CM | POA: Diagnosis not present

## 2017-01-10 DIAGNOSIS — G5782 Other specified mononeuropathies of left lower limb: Secondary | ICD-10-CM | POA: Diagnosis not present

## 2017-01-10 DIAGNOSIS — Z96652 Presence of left artificial knee joint: Secondary | ICD-10-CM | POA: Diagnosis not present

## 2017-01-10 DIAGNOSIS — M25662 Stiffness of left knee, not elsewhere classified: Secondary | ICD-10-CM | POA: Diagnosis not present

## 2017-02-18 DIAGNOSIS — E785 Hyperlipidemia, unspecified: Secondary | ICD-10-CM | POA: Diagnosis not present

## 2017-02-18 DIAGNOSIS — R109 Unspecified abdominal pain: Secondary | ICD-10-CM | POA: Diagnosis not present

## 2017-02-18 DIAGNOSIS — Z7982 Long term (current) use of aspirin: Secondary | ICD-10-CM | POA: Diagnosis not present

## 2017-02-20 ENCOUNTER — Other Ambulatory Visit: Payer: Self-pay | Admitting: Internal Medicine

## 2017-02-20 DIAGNOSIS — K5792 Diverticulitis of intestine, part unspecified, without perforation or abscess without bleeding: Secondary | ICD-10-CM

## 2017-02-26 ENCOUNTER — Ambulatory Visit
Admission: RE | Admit: 2017-02-26 | Discharge: 2017-02-26 | Disposition: A | Discharge status: Home / Self Care | Payer: PPO | Source: Ambulatory Visit | Attending: Internal Medicine | Admitting: Internal Medicine

## 2017-02-26 ENCOUNTER — Other Ambulatory Visit: Payer: Self-pay | Admitting: Internal Medicine

## 2017-02-26 DIAGNOSIS — K5792 Diverticulitis of intestine, part unspecified, without perforation or abscess without bleeding: Secondary | ICD-10-CM

## 2017-02-26 MED ORDER — IOPAMIDOL (ISOVUE-300) INJECTION 61%: 100.0000 mL | Freq: Once | INTRAVENOUS | Status: DC | PRN

## 2017-03-04 ENCOUNTER — Ambulatory Visit
Admission: RE | Admit: 2017-03-04 | Discharge: 2017-03-04 | Disposition: A | Discharge status: Home / Self Care | Payer: PPO | Source: Ambulatory Visit | Attending: Internal Medicine | Admitting: Internal Medicine

## 2017-03-04 ENCOUNTER — Other Ambulatory Visit: Payer: PPO

## 2017-03-04 DIAGNOSIS — K5732 Diverticulitis of large intestine without perforation or abscess without bleeding: Secondary | ICD-10-CM | POA: Diagnosis not present

## 2017-03-04 DIAGNOSIS — K5792 Diverticulitis of intestine, part unspecified, without perforation or abscess without bleeding: Secondary | ICD-10-CM

## 2017-03-04 MED ORDER — IOPAMIDOL (ISOVUE-300) INJECTION 61%
100.0000 mL | Freq: Once | INTRAVENOUS | Status: AC | PRN
  Administered 2017-03-04: 100 mL via INTRAVENOUS

## 2017-03-10 DIAGNOSIS — K5792 Diverticulitis of intestine, part unspecified, without perforation or abscess without bleeding: Secondary | ICD-10-CM | POA: Diagnosis not present

## 2017-03-10 DIAGNOSIS — Z86010 Personal history of colonic polyps: Secondary | ICD-10-CM | POA: Diagnosis not present

## 2017-03-11 DIAGNOSIS — H52201 Unspecified astigmatism, right eye: Secondary | ICD-10-CM | POA: Diagnosis not present

## 2017-03-11 DIAGNOSIS — H26493 Other secondary cataract, bilateral: Secondary | ICD-10-CM | POA: Diagnosis not present

## 2017-03-11 DIAGNOSIS — H1859 Other hereditary corneal dystrophies: Secondary | ICD-10-CM | POA: Diagnosis not present

## 2017-03-31 DIAGNOSIS — G5782 Other specified mononeuropathies of left lower limb: Secondary | ICD-10-CM | POA: Diagnosis not present

## 2017-03-31 DIAGNOSIS — Z471 Aftercare following joint replacement surgery: Secondary | ICD-10-CM | POA: Diagnosis not present

## 2017-03-31 DIAGNOSIS — Z96652 Presence of left artificial knee joint: Secondary | ICD-10-CM | POA: Diagnosis not present

## 2017-04-04 DIAGNOSIS — Z23 Encounter for immunization: Secondary | ICD-10-CM | POA: Diagnosis not present

## 2017-04-10 DIAGNOSIS — H26491 Other secondary cataract, right eye: Secondary | ICD-10-CM | POA: Diagnosis not present

## 2017-04-15 DIAGNOSIS — Z86010 Personal history of colonic polyps: Secondary | ICD-10-CM | POA: Diagnosis not present

## 2017-04-15 DIAGNOSIS — D126 Benign neoplasm of colon, unspecified: Secondary | ICD-10-CM | POA: Diagnosis not present

## 2017-04-15 DIAGNOSIS — K5730 Diverticulosis of large intestine without perforation or abscess without bleeding: Secondary | ICD-10-CM | POA: Diagnosis not present

## 2017-04-17 DIAGNOSIS — D126 Benign neoplasm of colon, unspecified: Secondary | ICD-10-CM | POA: Diagnosis not present

## 2017-05-08 NOTE — Progress Notes (Deleted)
;Subjective:    Joseph Hood was seen in consultation in the movement disorder clinic at the request of Deland Pretty, MD.  The evaluation is for tremor.  The patient has previously seen Dr. [name] as well as Dr. [name].  The patient was last seen by Dr. [name] in July, 2014.  I had the opportunity to review those notes.  The pt is R hand dominant.  Pt states that his father and paternal grandfather had tremor as well and his sister has tremor.  He first noted tremor in his 65's.  The L hand shakes worse than the R.  In the past, the patient has tried propranolol, but that did not seem to help.  I do not know the dose that the patient tried.  The patient did not think that primidone, 250 mg twice a day helped, but according to notes it was attempted to discontinue the medication and tremor got worse and the medication was reinitiated.  Dr. [name] did talk to the patient about DBS therapy and offered consults at [facility].  The patient does have a history of nephrolithiasis.  03/29/14 update:  Pt returns today for follow up.  States that he has thought about his options and he doesn't want DBS.  He was dx with anterior uveitis since last visit and a f/u visit yesterday. He was told that he had swelling of the retina.   He is being referred to a specialist for it and wants to wait until that gets cleared up before he takes new medication.  He has trouble putting the eye drops in because of the tremor.  He remains on primidone 250 mg bid and is still drinking miller light 3-5 cans per day but states that some days he doesn't drink at all.    03/28/15 update:  Pt returns for follow up.  He was last seen a year ago.  He is on primidone 250 mg bid.  We discussed trying gabapentin and he was to call me back about this but never did; would like to try now.  States that he had another episode of uveitis in the spring time.  He still has significant tremor, left more than right.  States that he has cut back on  his zoloft from 50 mg to 25 mg as was told that this could contribute to tremor.   02/07/16 update:  The patient returns today for follow-up, still on primidone, 250 mg twice a day. States that if he drinks wine, the tremor virtually stops.   Last visit, he was interested in starting low-dose gabapentin, 300 mg twice a day.  He called me back not long thereafter and stated that it caused worsening of tremor, dizziness and cognitive change.  He wanted to go off the medication.  He did not want to try another medication such as Lyrica.  The pharmacy did call at one point and stated that the patient had told them he was on 500 mg of primidone twice a day, but that was never in pharmacy records, nor was it in our records.  Pt states he is going to have R TKA in October.  2 weeks ago he had cataract surgery on the right.  Last week, he had a basal cell removed on the face.  He still has stitches.  10/08/16 update:  Pt seen in follow up.  Patient is on primidone, 250 mg twice per day.  The patient continues to have tremor.  He has not  wanted to pursue surgical options.  Pt states that he has had a PE last week and he was told to ask if zoloft, 25 mg, contributed to tremor as Dr. [name] told him it did.  He had a left total knee arthroplasty in October.  I did review those records.  He c/o L knee swelling.  2 knee surgeries on that last year and has a f/u appt on that last year.  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  He is on primidone, 250 mg twice per day.  He continues to have tremor, but other options, short of surgical options, are limited.  Current/Previously tried tremor medications: Propranolol, unknown dose; primidone; no Topamax due to history of nephrolithiasis x2; gabapentin - 300 mg twice a day caused dizziness  Current medications that may  exacerbate tremor:  None  Outside reports reviewed: historical medical records, lab reports and referral letter/letters.  Allergies  Allergen Reactions  . Augmentin [Amoxicillin-Pot Clavulanate] Other (See Comments)    Unknown - Hood allergies  . Tetracyclines & Related Other (See Comments)    Unknown- Hood allergies    Current Outpatient Medications on File Prior to Visit  Medication Sig Dispense Refill  . aspirin EC 81 MG tablet Take 81 mg by mouth daily.    . Multiple Vitamin (MULTIVITAMIN) tablet Take 1 tablet by mouth daily.      . primidone (MYSOLINE) 250 MG tablet Take 1 tablet (250 mg total) by mouth 2 (two) times daily. 180 tablet 2  . sertraline (ZOLOFT) 50 MG tablet Take 25 mg by mouth daily.     . simvastatin (ZOCOR) 20 MG tablet Take 20 mg by mouth at bedtime.      No current facility-administered medications on file prior to visit.     Past Medical History:  Diagnosis Date  . Angina   . Anxiety   . BPH (benign prostatic hyperplasia)   . Coronary artery disease   . Diverticulosis   . DVT (deep venous thrombosis) (HCC) 1990   LLE x2-  left calf is slight larger than right  . Essential tremor   . Heart disease   . History of kidney stones   . Hypercholesterolemia   . RBBB (right bundle branch block with left anterior fascicular block)   . Uveitis    bilateral  . Varicose veins of left lower extremity with pain     Past Surgical History:  Procedure Laterality Date  . APPENDECTOMY  1951  . CARDIAC CATHETERIZATION  1997  . CATARACT EXTRACTION Right 08/2013  . CORONARY ANGIOPLASTY  1997   stent   . INGUINAL HERNIA REPAIR Bilateral   . LITHOTRIPSY    . NOSE SURGERY  01/29/2016   MOHS surgery  . TOTAL KNEE ARTHROPLASTY Left 03/29/2016   Procedure: LEFT TOTAL KNEE ARTHROPLASTY;  Surgeon: Sydnee Cabal, MD;  Location: [facility];  Service: Orthopedics;  Laterality: Left;  . UMBILICAL HERNIA REPAIR      Social History   Socioeconomic History  . Marital  status: Widowed    Spouse name: Not on file  . Number of children: Not on file  . Years of education: Not on file  . Highest education level: Not on file  Social Needs  . Financial resource strain: Not on file  . Food insecurity - worry: Not  on file  . Food insecurity - inability: Not on file  . Transportation needs - medical: Not on file  . Transportation needs - non-medical: Not on file  Occupational History  . Occupation: retired    Fish farm manager: RETIRED  Tobacco Use  . Smoking status: Former Smoker    Packs/day: 2.00    Years: 35.00    Pack years: 70.00    Types: Cigarettes    Last attempt to quit: 06/29/1991    Years since quitting: 25.8  . Smokeless tobacco: Never Used  Substance and Sexual Activity  . Alcohol use: Yes    Comment: 2-3 beers daily  . Drug use: No  . Sexual activity: Not on file  Other Topics Concern  . Not on file  Social History Narrative   The patient is a widower with 1 year of college.His wife had a hemorrhagic stroke, but did well for 3 years  and then had metastatic disease to her brain and  died 19/10.  He has 2 children. He is retired and lives at home. He used to smoke 2 packs of cigarettes a day but quit 07/09/1992.  He drinks 3-4 beers a day and 1 diet coke.    Family Status  Relation Name Status  . Mother  Deceased       heart disease  . Father  Deceased       heart disease, EtOHism  . Daughter  Alive       healthy  . Son  Alive       healthy  . Sister  Alive       tremor, breast CA    Review of Systems A complete 10 system ROS was obtained and was negative apart from what is mentioned.   Objective:   VITALS:   There were no vitals filed for this visit. Gen:  Appears stated age and in NAD. HEENT:  Normocephalic.  He has healing wounds on the face due to basal cell on the right.   The mucous membranes are moist. The superficial temporal arteries are without ropiness or tenderness. Cardiovascular: Regular rate and rhythm. Lungs: Clear  to auscultation bilaterally. Neck: There are no carotid bruits noted bilaterally.  NEUROLOGICAL:  Orientation:  The patient is alert and oriented x 3.  Recent and remote memory are intact.   Cranial nerves: There is good facial symmetry. Extraocular muscles are intact and visual fields are full to confrontational testing. Speech is fluent and clear. Soft palate rises symmetrically and there is no tongue deviation. Hearing is intact to conversational tone. Tone: Tone is good throughout. Sensation: Sensation is intact to light touch throughout. Coordination:  The patient has no dysdiadichokinesia or dysmetria. Motor: Strength is 5/5 in the bilateral upper and lower extremities.  Shoulder shrug is equal bilaterally.  There is no pronator drift.  There are no fasciculations noted. Gait and Station: The patient is able to ambulate without difficulty.   MOVEMENT EXAM: Tremor:  There is tremor in the UE, noted most significantly with action.   The patients tremor is moderate to severe.  He has tremor in the arms and legs and the L is worse than the right.   It is not worse with a weight in the arm.  He has trouble archimedes spirals.    LABS:  Received lab work from his primary care physician dated 09/13/2016.  White blood cells were 6.6, hemoglobin 16.1, hematocrit 47.6 and platelets 173.  Sodium was 141, potassium 4.3, chloride 103, CO2  25, BUN 12, creatinine 0.88, AST 15, ALT 17, alkaline phosphatase 75     Assessment/Plan:   1.  Essential Tremor, moderately severe.  -This is evidenced by the symmetrical nature and longstanding hx of gradually getting worse.  I think it is unlikely that we will get significant control over tremor without DBS surgery, and we talked about this again today.  He has researched this and thought about it, and really is not interested in DBS surgery.  I would be nervous about a purely anticholinergic medication in his age group (Artane, Cogentin) but talked to him about  these as he is unhappy with tremor control.  Talked to him about cognitive change risk and risk of fall.  He wants to think about it and I agree with that.    -told him that I don't think that zoloft, 25 mg is contributing to tremor (pt asked)  -will try to get labs from PCP 2.  peripheral neuropathy on examination  -Pt states that he has only had 12 beers in 7 months which is greatly improved for him.   3.  F/u 6-8 months, sooner should new neuro issues arise.  Much greater than 50% of this visit was spent in counseling with the patient and the family.  Total face to face time:  25 min.

## 2017-05-09 ENCOUNTER — Ambulatory Visit: Payer: PPO | Admitting: Neurology

## 2017-05-09 ENCOUNTER — Encounter: Payer: Self-pay | Admitting: Psychology

## 2017-05-09 NOTE — Progress Notes (Addendum)
jst

## 2017-05-29 DIAGNOSIS — Z471 Aftercare following joint replacement surgery: Secondary | ICD-10-CM | POA: Diagnosis not present

## 2017-05-29 DIAGNOSIS — G5782 Other specified mononeuropathies of left lower limb: Secondary | ICD-10-CM | POA: Diagnosis not present

## 2017-05-29 DIAGNOSIS — Z96652 Presence of left artificial knee joint: Secondary | ICD-10-CM | POA: Diagnosis not present

## 2017-06-03 DIAGNOSIS — D492 Neoplasm of unspecified behavior of bone, soft tissue, and skin: Secondary | ICD-10-CM | POA: Diagnosis not present

## 2017-06-03 DIAGNOSIS — L820 Inflamed seborrheic keratosis: Secondary | ICD-10-CM | POA: Diagnosis not present

## 2017-07-29 ENCOUNTER — Ambulatory Visit: Payer: PPO | Admitting: Cardiovascular Disease

## 2017-07-29 ENCOUNTER — Encounter: Payer: Self-pay | Admitting: Cardiovascular Disease

## 2017-07-29 DIAGNOSIS — I2510 Atherosclerotic heart disease of native coronary artery without angina pectoris: Secondary | ICD-10-CM

## 2017-07-29 DIAGNOSIS — E7800 Pure hypercholesterolemia, unspecified: Secondary | ICD-10-CM | POA: Diagnosis not present

## 2017-07-29 NOTE — Assessment & Plan Note (Signed)
History of hyperlipidemia on statin therapy followed by his PCP 

## 2017-07-29 NOTE — Progress Notes (Signed)
07/29/2017 Joseph Hood   Apr 11, 1941  [id number]  Primary Physician Deland Pretty, MD Primary Cardiologist: Lorretta Harp MD Lupe Carney, [place]  HPI:  Joseph Hood is a 77 y.o.  widowed Caucasian male father to, grandfather 4 grandchildren referred for preoperative clearance before left total knee replacement. I last saw him in the office 02/27/16. His wife of 83 years passed away 9 years ago. His cardiac risk factor profile is remarkable for remote tobacco abuse having quit 20 years ago as well as hyperlipidemia. His mother did die of a myocardial infarction. He had stenting back in 1995 by Dr. Glade Lloyd and stress testing approximately 10 years ago by Dr. wall. He does have a benign essential tremor. He is retired from working at [employer]. He apparently needs elective left total knee replacement by Dr. Alvan Dame on 08/07/17 and was referred for cardiovascular clearance. He did have a Myoview performed 03/07/16 for preoperative clearance before a total knee replacement which was low risk and nonischemic. Since I saw him back a year and a half ago he's been a symptomatic.   Current Meds  Medication Sig  . aspirin EC 81 MG tablet Take 81 mg by mouth daily.  . Multiple Vitamin (MULTIVITAMIN) tablet Take 1 tablet by mouth daily.    . primidone (MYSOLINE) 250 MG tablet Take 1 tablet (250 mg total) by mouth 2 (two) times daily.  . sertraline (ZOLOFT) 50 MG tablet Take 25 mg by mouth daily.   . simvastatin (ZOCOR) 20 MG tablet Take 20 mg by mouth at bedtime.      Allergies  Allergen Reactions  . Augmentin [Amoxicillin-Pot Clavulanate] Other (See Comments)    Unknown - Older allergies  . Tetracyclines & Related Other (See Comments)    Unknown- Older allergies    Social History   Socioeconomic History  . Marital status: Widowed    Spouse name: Not on file  . Number of children: Not on file  . Years of education: Not on file  . Highest education level: Not on file    Social Needs  . Financial resource strain: Not on file  . Food insecurity - worry: Not on file  . Food insecurity - inability: Not on file  . Transportation needs - medical: Not on file  . Transportation needs - non-medical: Not on file  Occupational History  . Occupation: retired    Fish farm manager: RETIRED  Tobacco Use  . Smoking status: Former Smoker    Packs/day: 2.00    Years: 35.00    Pack years: 70.00    Types: Cigarettes    Last attempt to quit: 06/29/1991    Years since quitting: 26.1  . Smokeless tobacco: Never Used  Substance and Sexual Activity  . Alcohol use: Yes    Comment: 2-3 beers daily  . Drug use: No  . Sexual activity: Not on file  Other Topics Concern  . Not on file  Social History Narrative   The patient is a widower with 1 year of college.His wife had a hemorrhagic stroke, but did well for 3 years  and then had metastatic disease to her brain and  died 61/10.  He has 2 children. He is retired and lives at home. He used to smoke 2 packs of cigarettes a day but quit 07/09/1992.  He drinks 3-4 beers a day and 1 diet coke.     Review of Systems: General: negative for chills, fever, night sweats or weight changes.  Cardiovascular: negative for chest pain, dyspnea on exertion, edema, orthopnea, palpitations, paroxysmal nocturnal dyspnea or shortness of breath Dermatological: negative for rash Respiratory: negative for cough or wheezing Urologic: negative for hematuria Abdominal: negative for nausea, vomiting, diarrhea, bright red blood per rectum, melena, or hematemesis Neurologic: negative for visual changes, syncope, or dizziness All other systems reviewed and are otherwise negative except as noted above.    Blood pressure 140/90, pulse 69, height 6' (1.829 m), weight 192 lb (87.1 kg).  General appearance: alert and no distress Neck: no adenopathy, no carotid bruit, no JVD, supple, symmetrical, trachea midline and thyroid not enlarged, symmetric, no  tenderness/mass/nodules Lungs: clear to auscultation bilaterally Heart: regular rate and rhythm, S1, S2 normal, no murmur, click, rub or gallop Extremities: extremities normal, atraumatic, no cyanosis or edema Pulses: 2+ and symmetric Skin: Skin color, texture, turgor normal. No rashes or lesions Neurologic: Alert and oriented X 3, normal strength and tone. Normal symmetric reflexes. Normal coordination and gait  EKG sinus rhythm at 69 with right bundle branch block. I personally reviewed this EKG.  ASSESSMENT AND PLAN:   Hyperlipidemia History of hyperlipidemia on statin therapy followed by his PCP  Coronary artery disease History of CAD status post stenting back in 1995 by Dr. Glade Lloyd. He denies chest pain or shortness of breath. He did have a Myoview stress test performed 03/07/16 as preoperative clearance before a left total knee replacement which was nonischemic. He has been a symptomatic since.      Lorretta Harp MD FACP,FACC,FAHA, [facility] 07/29/2017 3:55 PM

## 2017-07-29 NOTE — Patient Instructions (Addendum)
Medication Instructions: Your physician recommends that you continue on your current medications as directed. Please refer to the Current Medication list given to you today.   Follow-Up: Your physician wants you to follow-up in: 1 year with Dr. Gwenlyn Found. You will receive a reminder letter in the mail two months in advance. If you don't receive a letter, please call our office to schedule the follow-up appointment.  You have been cleared for surgery at low risk. I have faxed a copy of the clearance letter to [facility].   If you need a refill on your cardiac medications before your next appointment, please call your pharmacy.

## 2017-07-29 NOTE — Assessment & Plan Note (Signed)
History of CAD status post stenting back in 1995 by Dr. Glade Lloyd. He denies chest pain or shortness of breath. He did have a Myoview stress test performed 03/07/16 as preoperative clearance before a left total knee replacement which was nonischemic. He has been a symptomatic since.

## 2017-07-30 NOTE — Addendum Note (Signed)
Addended by: Venetia Maxon on: 07/30/2017 02:52 PM   Modules accepted: Orders

## 2017-08-06 ENCOUNTER — Other Ambulatory Visit: Payer: Self-pay | Admitting: Neurology

## 2017-08-07 DIAGNOSIS — G5782 Other specified mononeuropathies of left lower limb: Secondary | ICD-10-CM | POA: Diagnosis not present

## 2017-08-08 ENCOUNTER — Telehealth: Payer: Self-pay | Admitting: Neurology

## 2017-08-08 MED ORDER — PRIMIDONE 250 MG PO TABS: 250.0000 mg | ORAL_TABLET | Freq: Two times a day (BID) | ORAL | 0 refills | Status: DC

## 2017-08-08 NOTE — Telephone Encounter (Signed)
Patient made appt to see dr tat on 11-14-17 at 3:00 please call medication to the [facility] on [address]. Please let patient know when it is called in or if we can not fill it

## 2017-08-08 NOTE — Telephone Encounter (Signed)
Tried to call patient to make him aware RX sent to pharmacy. No answer. No vm.

## 2017-08-08 NOTE — Telephone Encounter (Signed)
Patient no showed his appt in November and hasn't been seen since last April. He doesn't have an appointment scheduled. If you would like to schedule him a follow up appt I can refill his medication until that appointment, but won't be able to fill after if he doesn't keep it. Thanks.

## 2017-08-08 NOTE — Telephone Encounter (Signed)
Patient wants to know why we would not fill his primidone. He had knee surgery and wants to talk to someone

## 2017-08-08 NOTE — Telephone Encounter (Signed)
RX sent to pharmacy to last til appt.  

## 2017-09-22 DIAGNOSIS — E7800 Pure hypercholesterolemia, unspecified: Secondary | ICD-10-CM | POA: Diagnosis not present

## 2017-09-22 DIAGNOSIS — Z125 Encounter for screening for malignant neoplasm of prostate: Secondary | ICD-10-CM | POA: Diagnosis not present

## 2017-09-22 DIAGNOSIS — Z7982 Long term (current) use of aspirin: Secondary | ICD-10-CM | POA: Diagnosis not present

## 2017-09-25 DIAGNOSIS — G250 Essential tremor: Secondary | ICD-10-CM | POA: Diagnosis not present

## 2017-09-25 DIAGNOSIS — F419 Anxiety disorder, unspecified: Secondary | ICD-10-CM | POA: Diagnosis not present

## 2017-09-25 DIAGNOSIS — I2510 Atherosclerotic heart disease of native coronary artery without angina pectoris: Secondary | ICD-10-CM | POA: Diagnosis not present

## 2017-09-25 DIAGNOSIS — I4510 Unspecified right bundle-branch block: Secondary | ICD-10-CM | POA: Diagnosis not present

## 2017-09-25 DIAGNOSIS — R7303 Prediabetes: Secondary | ICD-10-CM | POA: Diagnosis not present

## 2017-09-25 DIAGNOSIS — Z0001 Encounter for general adult medical examination with abnormal findings: Secondary | ICD-10-CM | POA: Diagnosis not present

## 2017-09-25 DIAGNOSIS — Z1212 Encounter for screening for malignant neoplasm of rectum: Secondary | ICD-10-CM | POA: Diagnosis not present

## 2017-09-25 DIAGNOSIS — E785 Hyperlipidemia, unspecified: Secondary | ICD-10-CM | POA: Diagnosis not present

## 2017-09-25 DIAGNOSIS — M25562 Pain in left knee: Secondary | ICD-10-CM | POA: Diagnosis not present

## 2017-09-25 DIAGNOSIS — I8390 Asymptomatic varicose veins of unspecified lower extremity: Secondary | ICD-10-CM | POA: Diagnosis not present

## 2017-09-25 DIAGNOSIS — N400 Enlarged prostate without lower urinary tract symptoms: Secondary | ICD-10-CM | POA: Diagnosis not present

## 2017-09-25 DIAGNOSIS — R61 Generalized hyperhidrosis: Secondary | ICD-10-CM | POA: Diagnosis not present

## 2017-09-25 DIAGNOSIS — F339 Major depressive disorder, recurrent, unspecified: Secondary | ICD-10-CM | POA: Diagnosis not present

## 2017-09-30 ENCOUNTER — Ambulatory Visit (INDEPENDENT_AMBULATORY_CARE_PROVIDER_SITE_OTHER): Payer: PPO | Admitting: Sports Medicine

## 2017-09-30 ENCOUNTER — Ambulatory Visit: Payer: PPO | Admitting: Sports Medicine

## 2017-09-30 ENCOUNTER — Encounter: Payer: Self-pay | Admitting: Sports Medicine

## 2017-09-30 VITALS — BP 142/70 | Ht 71.5 in | Wt 188.0 lb

## 2017-09-30 DIAGNOSIS — M25562 Pain in left knee: Secondary | ICD-10-CM

## 2017-09-30 DIAGNOSIS — G8929 Other chronic pain: Secondary | ICD-10-CM

## 2017-10-01 ENCOUNTER — Encounter: Payer: Self-pay | Admitting: Sports Medicine

## 2017-10-01 NOTE — Progress Notes (Signed)
   Subjective:    Patient ID: Joseph Hood, male    DOB: Aug 24, 1940, 77 y.o.   MRN: [id number]  HPI  Chief complaint: Left knee pain  Very pleasant 77 year old male comes in today at the request of Dr.Pharr for evaluation of chronic left knee pain. Patient has had 3 surgeries in the past 2 years. First 2 surgeries were done by Dr. Theda Sers. First surgery was a meniscectomy 2-3 years ago. When pain persisted, patient underwent a total knee arthroplasty. When patient continued to have pain status post arthroplasty, Dr. Theda Sers referred him to Dr. Alvan Dame for a third surgical procedure. It is unclear exactly what procedure was performed, but the patient states that it was some sort of "nerve surgery". This did alleviate some of his knee pain but not all of it. He endorses a chronic pain along the anterior medial aspect of the left knee which is worse at night.he has a difficult time describing the quality of pain. Sometimes it is achy and sometimes it is burning. Pain will awaken him from sleep. It is also present when walking for long periods of time on a hard surface. He takes intermittent doses of ibuprofen which is somewhat helpful. He is supposed to return to Dr.Olin in a couple of months but has not scheduled a follow-up visit yet. He denies swelling. No fevers or chills. No feelings of instability he does state that he will get occasional catching and clicking in the knee.  Past medical history reviewed Medications reviewed Allergies reviewed    Review of Systems As above    Objective:   Physical Exam  Well-developed, well-nourished. No acute distress. Patient has a significant resting tremor. Vital signs reviewed  Left knee: Patient lacks about 3 degrees of extension. Flexion is to 120-130. Well-healed midline surgical incision consistent with his previous total knee arthroplasty. No swelling. No effusion. He is tender to palpation along the anterior medial tibia and joint line. No  tenderness over the patellar tendon. Knee is grossly stable to valgus and varus stressing. No pain. Neurovascularly intact distally. Walking with a slight limp.      Assessment & Plan:   Chronic left knee pain status post multiple surgeries  Long talk with this patient regarding his condition.A total of 30 minutes was spent in the office in face-to-face consultation. Greater than 50% of the time was spent discussing his ongoing need to lumbar and treatment going forward. His most recent surgery was in February by Dr.Olin. He does endorse some improvement with his pain since that procedure. I recommended that he return to Dr.Olin as scheduled. He was disappointed that he saw the physician assistant at his last postop follow-up visit and not Dr.Olin. He will be sure to follow-up with the surgeon at his next visit. I'm certainly happy to pursue further workup if necessary but the patient is adamant about not wanting any aggressive treatment. I think the best thing to do at this point in time is simply wait to see if his symptoms improve after his most recent surgery. Patient agrees with this plan. I'm happy to see him back as needed.

## 2017-10-06 ENCOUNTER — Ambulatory Visit: Payer: PPO | Admitting: Family Medicine

## 2017-10-30 DIAGNOSIS — M25562 Pain in left knee: Secondary | ICD-10-CM | POA: Diagnosis not present

## 2017-10-30 DIAGNOSIS — F339 Major depressive disorder, recurrent, unspecified: Secondary | ICD-10-CM | POA: Diagnosis not present

## 2017-11-13 NOTE — Progress Notes (Signed)
;Subjective:    Joseph Hood was seen in consultation in the movement disorder clinic at the request of Deland Pretty, MD.  The evaluation is for tremor.  The patient has previously seen Dr. [name] as well as Dr. [name].  The patient was last seen by Dr. [name] in July, 2014.  I had the opportunity to review those notes.  The pt is R hand dominant.  Pt states that his father and paternal grandfather had tremor as well and his sister has tremor.  He first noted tremor in his 65's.  The L hand shakes worse than the R.  In the past, the patient has tried propranolol, but that did not seem to help.  I do not know the dose that the patient tried.  The patient did not think that primidone, 250 mg twice a day helped, but according to notes it was attempted to discontinue the medication and tremor got worse and the medication was reinitiated.  Dr. [name] did talk to the patient about DBS therapy and offered consults at [facility].  The patient does have a history of nephrolithiasis.  03/29/14 update:  Pt returns today for follow up.  States that he has thought about his options and he doesn't want DBS.  He was dx with anterior uveitis since last visit and a f/u visit yesterday. He was told that he had swelling of the retina.   He is being referred to a specialist for it and wants to wait until that gets cleared up before he takes new medication.  He has trouble putting the eye drops in because of the tremor.  He remains on primidone 250 mg bid and is still drinking miller light 3-5 cans per day but states that some days he doesn't drink at all.    03/28/15 update:  Pt returns for follow up.  He was last seen a year ago.  He is on primidone 250 mg bid.  We discussed trying gabapentin and he was to call me back about this but never did; would like to try now.  States that he had another episode of uveitis in the spring time.  He still has significant tremor, left more than right.  States that he has cut back on  his zoloft from 50 mg to 25 mg as was told that this could contribute to tremor.   02/07/16 update:  The patient returns today for follow-up, still on primidone, 250 mg twice a day. States that if he drinks wine, the tremor virtually stops.   Last visit, he was interested in starting low-dose gabapentin, 300 mg twice a day.  He called me back not long thereafter and stated that it caused worsening of tremor, dizziness and cognitive change.  He wanted to go off the medication.  He did not want to try another medication such as Lyrica.  The pharmacy did call at one point and stated that the patient had told them he was on 500 mg of primidone twice a day, but that was never in pharmacy records, nor was it in our records.  Pt states he is going to have R TKA in October.  2 weeks ago he had cataract surgery on the right.  Last week, he had a basal cell removed on the face.  He still has stitches.  10/08/16 update:  Pt seen in follow up.  Patient is on primidone, 250 mg twice per day.  The patient continues to have tremor.  He has not  wanted to pursue surgical options.  Pt states that he has had a PE last week and he was told to ask if zoloft, 25 mg, contributed to tremor as Dr. [name] told him it did.  He had a left total knee arthroplasty in October.  I did review those records.  He c/o L knee swelling.  2 knee surgeries on that last year and has a f/u appt on that last year.  He is no longer drinking EtOH (12 beers in 7 months per pt) but he has changed out this addiction for an "ice cream addiction."  11/14/17 update: Patient is seen today in follow-up.  He is on primidone, 250 mg twice per day.  This does not adequately control tremor, but given potential side effects of medications, he has not wanted anything else and has not wanted to pursue DBS surgery. He states today that he may have changed his mind.  He asks me many questions about this today.  The records that were made available to me were reviewed.  Pt  seen by cardiology last on 07/29/17 for cardiac clearance for surgery for the knee, which was done in Feb by Dr. Alvan Dame.  Pt states that this was 3rd knee surgery but pt states that it was another failed surgery.  Pt states that pain level is only 1-3/10 during the day but 6/10 at night.  Using lidocaine patches at night.  Patient asked several times about alcohol.  States that sometimes he will drink and then he will have no tremor.  He states that he will drink 3 beers at a time.  He states that he quit drinking altogether around the time for his knee surgery, but has started back drinking again.  He cannot tell me how often he drinks, just states that when he does he drinks 3 beers at a time.    Current/Previously tried tremor medications: Propranolol, unknown dose; primidone; no Topamax due to history of nephrolithiasis x2; gabapentin - 300 mg twice a day caused dizziness  Current medications that may exacerbate tremor:  None  Outside reports reviewed: historical medical records, lab reports and referral letter/letters.  No Known Allergies  Current Outpatient Medications on File Prior to Visit  Medication Sig Dispense Refill  . aspirin EC 81 MG tablet Take 81 mg by mouth daily.    . Multiple Vitamin (MULTIVITAMIN) tablet Take 1 tablet by mouth daily.      . sertraline (ZOLOFT) 50 MG tablet Take 25 mg by mouth daily.     . simvastatin (ZOCOR) 20 MG tablet Take 20 mg by mouth at bedtime.      No current facility-administered medications on file prior to visit.     Past Medical History:  Diagnosis Date  . Angina   . Anxiety   . BPH (benign prostatic hyperplasia)   . Coronary artery disease   . Diverticulosis   . DVT (deep venous thrombosis) (HCC) 1990   LLE x2-  left calf is slight larger than right  . Essential tremor   . Heart disease   . History of kidney stones   . Hypercholesterolemia   . RBBB (right bundle branch block with left anterior fascicular block)   . Uveitis     bilateral  . Varicose veins of left lower extremity with pain     Past Surgical History:  Procedure Laterality Date  . APPENDECTOMY  1951  . CARDIAC CATHETERIZATION  1997  . CATARACT EXTRACTION Right 08/2013  . CORONARY ANGIOPLASTY  1997   stent   . INGUINAL HERNIA REPAIR Bilateral   . LITHOTRIPSY    . NOSE SURGERY  01/29/2016   MOHS surgery  . TOTAL KNEE ARTHROPLASTY Left 03/29/2016   Procedure: LEFT TOTAL KNEE ARTHROPLASTY;  Surgeon: Sydnee Cabal, MD;  Location: [facility];  Service: Orthopedics;  Laterality: Left;  . UMBILICAL HERNIA REPAIR      Social History   Socioeconomic History  . Marital status: Widowed    Spouse name: Not on file  . Number of children: Not on file  . Years of education: Not on file  . Highest education level: Not on file  Occupational History  . Occupation: retired    Fish farm manager: RETIRED  Social Needs  . Financial resource strain: Not on file  . Food insecurity:    Worry: Not on file    Inability: Not on file  . Transportation needs:    Medical: Not on file    Non-medical: Not on file  Tobacco Use  . Smoking status: Former Smoker    Packs/day: 2.00    Years: 35.00    Pack years: 70.00    Types: Cigarettes    Last attempt to quit: 06/29/1991    Years since quitting: 26.3  . Smokeless tobacco: Never Used  Substance and Sexual Activity  . Alcohol use: Yes    Comment: 2-3 beers daily  . Drug use: No  . Sexual activity: Not on file  Lifestyle  . Physical activity:    Days per week: Not on file    Minutes per session: Not on file  . Stress: Not on file  Relationships  . Social connections:    Talks on phone: Not on file    Gets together: Not on file    Attends religious service: Not on file    Active member of club or organization: Not on file    Attends meetings of clubs or organizations: Not on file    Relationship status: Not on file  . Intimate partner violence:    Fear of current or ex partner: Not on file    Emotionally abused:  Not on file    Physically abused: Not on file    Forced sexual activity: Not on file  Other Topics Concern  . Not on file  Social History Narrative   The patient is a widower with 1 year of college.His wife had a hemorrhagic stroke, but did well for 3 years  and then had metastatic disease to her brain and  died 19/10.  He has 2 children. He is retired and lives at home. He used to smoke 2 packs of cigarettes a day but quit 07/09/1992.  He drinks 3-4 beers a day and 1 diet coke.    Family Status  Relation Name Status  . Mother  Deceased       heart disease  . Father  Deceased       heart disease, EtOHism  . Daughter  Alive       healthy  . Son  Alive       healthy  . Sister  Alive       tremor, breast CA    Review of Systems A complete 10 system ROS was obtained and was negative apart from what is mentioned.   Objective:   VITALS:   Vitals:   11/14/17 1457  BP: 132/80  Pulse: 80  SpO2: 94%  Weight: 189 lb (85.7 kg)  Height: 5' 11.5" (1.816 m)  GEN:  The patient appears stated age and is in NAD. HEENT:  Normocephalic, atraumatic.  The mucous membranes are moist. The superficial temporal arteries are without ropiness or tenderness. CV:  RRR Lungs:  CTAB Neck/HEME:  There are no carotid bruits bilaterally.  Neurological examination:  Orientation: The patient is alert and oriented x3. Cranial nerves: There is good facial symmetry. The speech is fluent and clear. Soft palate rises symmetrically and there is no tongue deviation. Hearing is intact to conversational tone. Sensation: Sensation is intact to light touch throughout Motor: Strength is 5/5 in the bilateral upper and lower extremities.   Shoulder shrug is equal and symmetric.  There is no pronator drift.  MOVEMENT EXAM: Tremor:  There is tremor in the UE, noted most significantly with action.   The patients tremor is moderate to severe.  He has tremor in the arms and legs and the L is worse than the right.  He has  chin tremor.  It is not worse with a weight in the arm.  He has trouble archimedes spirals.    LABS:  Received lab work from his primary care physician dated 09/13/2016.  White blood cells were 6.6, hemoglobin 16.1, hematocrit 47.6 and platelets 173.  Sodium was 141, potassium 4.3, chloride 103, CO2 25, BUN 12, creatinine 0.88, AST 15, ALT 17, alkaline phosphatase 75     Assessment/Plan:   1.  Essential Tremor, moderately severe.  -This is evidenced by the symmetrical nature and longstanding hx of gradually getting worse.  I think it is unlikely that we will get significant control over tremor without DBS surgery, and we talked about this again today.  He has not wanted to pursue this in the past, but is interested in thinking about it again.  He asked me many questions and I answered them to the best of my ability.  I showed him HIPAA compliant videos of patient who has had tremor.  He and I discussed extensively risks, benefits, and side effects.  He also asked me about logistics of the procedure and we discussed this.  We discussed different device types as well as different battery types.  We discussed the need for neurocognitive testing.  He will let me know if he wishes to proceed, as he needs to think about this further.  Social support may be an issue, as his wife died in his girlfriend has Parkinson's disease.  -Patient and I discussed alcohol.  He brought this up at least 4 times during the visit.  Explained that I do not want him treating his tremor with alcohol.  He had backed down on it, and it appears that he is drinking more again.  Told him we would not do surgical intervention while he is drinking.  -For now, the patient will continue on primidone, 250 mg twice per day.  -told him that I don't think that zoloft, 25 mg is contributing to tremor (pt asked)  -will try to get labs from PCP 2.  peripheral neuropathy on examination  -Likely due to alcohol. 3.  If he decides not to have  surgery, then I was just see him back in about 9 months to 1 year.  Otherwise, he will let me know and we can proceed with cognitive testing.  Much greater than 50% of this visit was spent in counseling and coordinating care.  Total face to face time:  30 min

## 2017-11-14 ENCOUNTER — Encounter: Payer: Self-pay | Admitting: Neurology

## 2017-11-14 ENCOUNTER — Ambulatory Visit (INDEPENDENT_AMBULATORY_CARE_PROVIDER_SITE_OTHER): Payer: PPO | Admitting: Neurology

## 2017-11-14 VITALS — BP 132/80 | HR 80 | Ht 71.5 in | Wt 189.0 lb

## 2017-11-14 DIAGNOSIS — G250 Essential tremor: Secondary | ICD-10-CM

## 2017-11-14 DIAGNOSIS — G609 Hereditary and idiopathic neuropathy, unspecified: Secondary | ICD-10-CM | POA: Diagnosis not present

## 2017-11-14 MED ORDER — PRIMIDONE 250 MG PO TABS: 250.0000 mg | ORAL_TABLET | Freq: Two times a day (BID) | ORAL | 2 refills | Status: DC

## 2017-11-19 IMAGING — NM NM MISC PROCEDURE
6 series · 36 of 36 positions shown · non-contrast
Comparison: none

[Series 1: wbr_r-proj_st wbr rest · 6.40mm/px · 6 of 64 frames shown]
[frame 6/64]
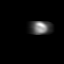
[frame 16/64]
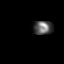
[frame 27/64]
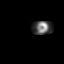
[frame 38/64]
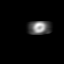
[frame 48/64]
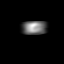
[frame 59/64]
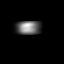

[Series 1: wbr rest · 6.40mm/px · 6 of 59 frames shown]
[frame 5/59]
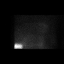
[frame 15/59]
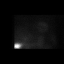
[frame 25/59]
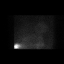
[frame 35/59]
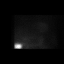
[frame 45/59]
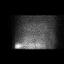
[frame 55/59]
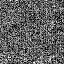

[Series 2: wbr stress-gsp · 6.40mm/px · 6 of 512 frames shown]
[frame 43/512]
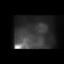
[frame 128/512]
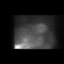
[frame 214/512]
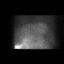
[frame 299/512]
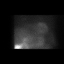
[frame 384/512]
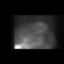
[frame 470/512]
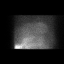

[Series 2: wbr_s-proj_st wbr stress-gsp · 6.40mm/px · 6 of 512 frames shown]
[frame 43/512]
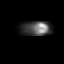
[frame 128/512]
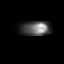
[frame 214/512]
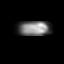
[frame 299/512]
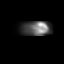
[frame 384/512]
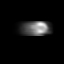
[frame 470/512]
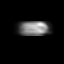

[Series 3: wbr stress-sum-em · 6.40mm/px · 6 of 64 frames shown]
[frame 6/64]
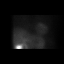
[frame 16/64]
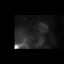
[frame 27/64]
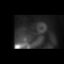
[frame 38/64]
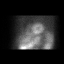
[frame 48/64]
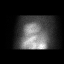
[frame 59/64]
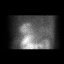

[Series 3: wbr_s-proj_st wbr stress-sum-em · 6.40mm/px · 6 of 64 frames shown]
[frame 6/64]
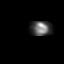
[frame 16/64]
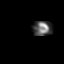
[frame 27/64]
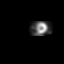
[frame 38/64]
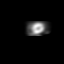
[frame 48/64]
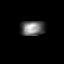
[frame 59/64]
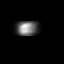

[36 of 36 positions shown; findings below may reference images not displayed]

Canned report from images found in remote index.

Refer to host system for actual result text.

## 2018-01-27 ENCOUNTER — Telehealth: Payer: Self-pay | Admitting: Neurology

## 2018-01-27 NOTE — Telephone Encounter (Signed)
Patient has thought about the DBS and doe not want to have that done. He will still keep coming to see Dr Tat

## 2018-01-27 NOTE — Telephone Encounter (Signed)
Dr. Carles Collet Juluis Rainier. Patient has a follow up appt in January.

## 2018-03-10 DIAGNOSIS — Z23 Encounter for immunization: Secondary | ICD-10-CM | POA: Diagnosis not present

## 2018-04-07 DIAGNOSIS — H9192 Unspecified hearing loss, left ear: Secondary | ICD-10-CM | POA: Diagnosis not present

## 2018-04-07 DIAGNOSIS — H6123 Impacted cerumen, bilateral: Secondary | ICD-10-CM | POA: Diagnosis not present

## 2018-04-14 DIAGNOSIS — C44519 Basal cell carcinoma of skin of other part of trunk: Secondary | ICD-10-CM | POA: Diagnosis not present

## 2018-04-14 DIAGNOSIS — L821 Other seborrheic keratosis: Secondary | ICD-10-CM | POA: Diagnosis not present

## 2018-04-14 DIAGNOSIS — C4491 Basal cell carcinoma of skin, unspecified: Secondary | ICD-10-CM

## 2018-04-14 DIAGNOSIS — D229 Melanocytic nevi, unspecified: Secondary | ICD-10-CM | POA: Diagnosis not present

## 2018-04-14 HISTORY — DX: Basal cell carcinoma of skin, unspecified: C44.91

## 2018-05-27 DIAGNOSIS — T1512XA Foreign body in conjunctival sac, left eye, initial encounter: Secondary | ICD-10-CM | POA: Diagnosis not present

## 2018-05-27 DIAGNOSIS — H5712 Ocular pain, left eye: Secondary | ICD-10-CM | POA: Diagnosis not present

## 2018-06-03 DIAGNOSIS — C44519 Basal cell carcinoma of skin of other part of trunk: Secondary | ICD-10-CM | POA: Diagnosis not present
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
## 2018-07-17 ENCOUNTER — Ambulatory Visit (INDEPENDENT_AMBULATORY_CARE_PROVIDER_SITE_OTHER): Payer: PPO | Admitting: Neurology

## 2018-07-17 ENCOUNTER — Encounter: Payer: Self-pay | Admitting: Neurology

## 2018-07-17 VITALS — BP 136/80 | HR 86 | Ht 71.75 in | Wt 183.0 lb

## 2018-07-17 DIAGNOSIS — G250 Essential tremor: Secondary | ICD-10-CM

## 2018-07-17 MED ORDER — PRIMIDONE 50 MG PO TABS: ORAL_TABLET | ORAL | 0 refills | Status: DC

## 2018-07-17 NOTE — Patient Instructions (Signed)
1. Decrease Primidone as follows:  250 mg in the morning, 200 mg in the evening for one week, THEN 200 mg in the morning, 200 mg in the evening for one week,  THEN 200 mg in the morning, 150 mg in the evening for one week,  THEN 150 mg in the morning, 150 mg in the evening for one week, THEN 150 mg in the morning, 100 mg in the evening for one week, THEN 100 mg in the morning, 100 mg in the evening for one week, THEN 100 mg in the morning, 50 mg in the evening for one week, THEN 50 mg in the morning, 50 mg in the evening for one week, THEN 50 mg in the morning for one week THEN stop medication.

## 2018-07-20 ENCOUNTER — Telehealth: Payer: Self-pay | Admitting: Neurology

## 2018-07-20 NOTE — Telephone Encounter (Signed)
Patient is calling in about his Friday appt and talked about the Brain thing "[facility]" they do in [place] asked him if he wanted to be on the waiting list and at the time he said no but now he wants to be added to the list. Please call to let him know you did and you can leave msg. Thanks!

## 2018-07-20 NOTE — Telephone Encounter (Signed)
Okay to refer for focused ultrasound?

## 2018-07-20 NOTE — Telephone Encounter (Signed)
Referral submitted through [facility], through online portal per their preference. They should contact patient to schedule an appt. Wait maybe quite awhile and since patient may go back on Primidone, I will leave appt on schedule for now. Spoke with patient and he was made aware.

## 2018-07-20 NOTE — Telephone Encounter (Signed)
Confirmation: Referral Request Sent Thank you. Your request has been submitted to the [facility].  Your Request Confirmation Number is: [id number]  A representative from the [facility] will contact you at [phone] between 8:00 am - 5:00 pm EST Monday - Friday to help schedule an appointment.  If you have any questions, regarding this request -[id number]- please call [phone].  You've submitted the following information:  First Name: Joseph  Last Name: Hood  Gender: Male  Date of Birth: [birth year]  Reason for Appointment: Patient is over 40 year history of essential tremor wants evaluation for focused ultrasound.

## 2018-07-20 NOTE — Telephone Encounter (Signed)
Okay.  Doesn't need f/u here so can remove that.

## 2018-07-31 ENCOUNTER — Other Ambulatory Visit: Payer: Self-pay | Admitting: Neurology

## 2018-08-17 DIAGNOSIS — M9903 Segmental and somatic dysfunction of lumbar region: Secondary | ICD-10-CM | POA: Diagnosis not present

## 2018-08-17 DIAGNOSIS — M5136 Other intervertebral disc degeneration, lumbar region: Secondary | ICD-10-CM | POA: Diagnosis not present

## 2018-08-17 DIAGNOSIS — M545 Low back pain: Secondary | ICD-10-CM | POA: Diagnosis not present

## 2018-08-18 DIAGNOSIS — M9903 Segmental and somatic dysfunction of lumbar region: Secondary | ICD-10-CM | POA: Diagnosis not present

## 2018-08-18 DIAGNOSIS — M5136 Other intervertebral disc degeneration, lumbar region: Secondary | ICD-10-CM | POA: Diagnosis not present

## 2018-08-18 DIAGNOSIS — M545 Low back pain: Secondary | ICD-10-CM | POA: Diagnosis not present

## 2018-08-19 DIAGNOSIS — M545 Low back pain: Secondary | ICD-10-CM | POA: Diagnosis not present

## 2018-08-19 DIAGNOSIS — M5136 Other intervertebral disc degeneration, lumbar region: Secondary | ICD-10-CM | POA: Diagnosis not present

## 2018-08-19 DIAGNOSIS — M9903 Segmental and somatic dysfunction of lumbar region: Secondary | ICD-10-CM | POA: Diagnosis not present

## 2018-08-20 DIAGNOSIS — M5136 Other intervertebral disc degeneration, lumbar region: Secondary | ICD-10-CM | POA: Diagnosis not present

## 2018-08-20 DIAGNOSIS — M9903 Segmental and somatic dysfunction of lumbar region: Secondary | ICD-10-CM | POA: Diagnosis not present

## 2018-08-20 DIAGNOSIS — M545 Low back pain: Secondary | ICD-10-CM | POA: Diagnosis not present

## 2018-08-24 ENCOUNTER — Telehealth: Payer: Self-pay | Admitting: Neurology

## 2018-08-24 DIAGNOSIS — M9903 Segmental and somatic dysfunction of lumbar region: Secondary | ICD-10-CM | POA: Diagnosis not present

## 2018-08-24 DIAGNOSIS — M545 Low back pain: Secondary | ICD-10-CM | POA: Diagnosis not present

## 2018-08-24 DIAGNOSIS — M5136 Other intervertebral disc degeneration, lumbar region: Secondary | ICD-10-CM | POA: Diagnosis not present

## 2018-08-24 NOTE — Telephone Encounter (Signed)
Spoke with patient.  He states they will not accept him for focused ultrasound due to neuropathy. He wants to remain here as a patient.  He has been weaning off Primidone and discovered his tremor is significantly worse. He is down to 150 mg in the morning, 100 mg at night and wants to increase back to 250 mg BID.  I advised him as follows:  This is the week after he has taken 150 mg am, 100 mg pm for one week. I advised to take 150 mg am, 150 mg in the pm for one week, then 200 mg in the am, 150 mg in the pm for one week, then 200 mg in the am for one week, 200 mg in the pm for one week, then 250 mg in the am, 200 mg in the pm for one week then 250 mg BID.  Patient is aware of instructions. He is to contact me if he doesn't have enough medication to titrate back up. He has an appt in August for follow up.  Dr. Carles Collet Juluis Rainier.

## 2018-08-24 NOTE — Telephone Encounter (Signed)
Patient left a VM and would like to Speak with DR Tat personally about his medication,please call

## 2018-08-27 DIAGNOSIS — M545 Low back pain: Secondary | ICD-10-CM | POA: Diagnosis not present

## 2018-08-27 DIAGNOSIS — M5136 Other intervertebral disc degeneration, lumbar region: Secondary | ICD-10-CM | POA: Diagnosis not present

## 2018-08-27 DIAGNOSIS — M9903 Segmental and somatic dysfunction of lumbar region: Secondary | ICD-10-CM | POA: Diagnosis not present

## 2018-08-31 DIAGNOSIS — M5136 Other intervertebral disc degeneration, lumbar region: Secondary | ICD-10-CM | POA: Diagnosis not present

## 2018-08-31 DIAGNOSIS — M9903 Segmental and somatic dysfunction of lumbar region: Secondary | ICD-10-CM | POA: Diagnosis not present

## 2018-08-31 DIAGNOSIS — M545 Low back pain: Secondary | ICD-10-CM | POA: Diagnosis not present

## 2018-09-08 DIAGNOSIS — M545 Low back pain: Secondary | ICD-10-CM | POA: Diagnosis not present

## 2018-09-08 DIAGNOSIS — M5136 Other intervertebral disc degeneration, lumbar region: Secondary | ICD-10-CM | POA: Diagnosis not present

## 2018-09-08 DIAGNOSIS — M9903 Segmental and somatic dysfunction of lumbar region: Secondary | ICD-10-CM | POA: Diagnosis not present

## 2018-09-11 ENCOUNTER — Emergency Department (HOSPITAL_COMMUNITY)
Admission: EM | Admit: 2018-09-11 | Discharge: 2018-09-11 | Disposition: A | Discharge status: Home / Self Care | Payer: PPO | Attending: Emergency Medicine | Admitting: Emergency Medicine

## 2018-09-11 ENCOUNTER — Other Ambulatory Visit: Payer: Self-pay

## 2018-09-11 ENCOUNTER — Emergency Department (HOSPITAL_COMMUNITY): Payer: PPO

## 2018-09-11 ENCOUNTER — Encounter (HOSPITAL_COMMUNITY): Payer: Self-pay | Admitting: *Deleted

## 2018-09-11 DIAGNOSIS — S0001XA Abrasion of scalp, initial encounter: Secondary | ICD-10-CM | POA: Insufficient documentation

## 2018-09-11 DIAGNOSIS — Z79899 Other long term (current) drug therapy: Secondary | ICD-10-CM | POA: Diagnosis not present

## 2018-09-11 DIAGNOSIS — I2510 Atherosclerotic heart disease of native coronary artery without angina pectoris: Secondary | ICD-10-CM | POA: Diagnosis not present

## 2018-09-11 DIAGNOSIS — Z87891 Personal history of nicotine dependence: Secondary | ICD-10-CM | POA: Insufficient documentation

## 2018-09-11 DIAGNOSIS — Z7982 Long term (current) use of aspirin: Secondary | ICD-10-CM | POA: Diagnosis not present

## 2018-09-11 DIAGNOSIS — Y999 Unspecified external cause status: Secondary | ICD-10-CM | POA: Diagnosis not present

## 2018-09-11 DIAGNOSIS — Z23 Encounter for immunization: Secondary | ICD-10-CM | POA: Diagnosis not present

## 2018-09-11 DIAGNOSIS — S0990XA Unspecified injury of head, initial encounter: Secondary | ICD-10-CM

## 2018-09-11 DIAGNOSIS — Y9389 Activity, other specified: Secondary | ICD-10-CM | POA: Diagnosis not present

## 2018-09-11 DIAGNOSIS — S0003XA Contusion of scalp, initial encounter: Secondary | ICD-10-CM | POA: Diagnosis not present

## 2018-09-11 DIAGNOSIS — I509 Heart failure, unspecified: Secondary | ICD-10-CM | POA: Insufficient documentation

## 2018-09-11 DIAGNOSIS — W010XXA Fall on same level from slipping, tripping and stumbling without subsequent striking against object, initial encounter: Secondary | ICD-10-CM | POA: Insufficient documentation

## 2018-09-11 DIAGNOSIS — I4510 Unspecified right bundle-branch block: Secondary | ICD-10-CM | POA: Diagnosis not present

## 2018-09-11 DIAGNOSIS — Y929 Unspecified place or not applicable: Secondary | ICD-10-CM | POA: Diagnosis not present

## 2018-09-11 LAB — URINALYSIS, ROUTINE W REFLEX MICROSCOPIC
Bacteria, UA: NONE SEEN
Bilirubin Urine: NEGATIVE
Glucose, UA: NEGATIVE mg/dL
Ketones, ur: NEGATIVE mg/dL
Leukocytes,Ua: NEGATIVE
Nitrite: NEGATIVE
PROTEIN: NEGATIVE mg/dL
Specific Gravity, Urine: 1.003 — ABNORMAL LOW (ref 1.005–1.030)
pH: 6 (ref 5.0–8.0)

## 2018-09-11 LAB — CBC WITH DIFFERENTIAL/PLATELET
ABS IMMATURE GRANULOCYTES: 0.09 10*3/uL — AB (ref 0.00–0.07)
Basophils Absolute: 0 10*3/uL (ref 0.0–0.1)
Basophils Relative: 0 %
Eosinophils Absolute: 0 10*3/uL (ref 0.0–0.5)
Eosinophils Relative: 0 %
HCT: 48.7 % (ref 39.0–52.0)
Hemoglobin: 15.8 g/dL (ref 13.0–17.0)
Immature Granulocytes: 1 %
Lymphocytes Relative: 7 %
Lymphs Abs: 1.1 10*3/uL (ref 0.7–4.0)
MCH: 31.1 pg (ref 26.0–34.0)
MCHC: 32.4 g/dL (ref 30.0–36.0)
MCV: 95.9 fL (ref 80.0–100.0)
Monocytes Absolute: 0.8 10*3/uL (ref 0.1–1.0)
Monocytes Relative: 5 %
Neutro Abs: 13.4 10*3/uL — ABNORMAL HIGH (ref 1.7–7.7)
Neutrophils Relative %: 87 %
PLATELETS: 178 10*3/uL (ref 150–400)
RBC: 5.08 MIL/uL (ref 4.22–5.81)
RDW: 13.5 % (ref 11.5–15.5)
WBC: 15.4 10*3/uL — ABNORMAL HIGH (ref 4.0–10.5)
nRBC: 0 % (ref 0.0–0.2)

## 2018-09-11 LAB — BASIC METABOLIC PANEL
Anion gap: 13 (ref 5–15)
BUN: 13 mg/dL (ref 8–23)
CO2: 20 mmol/L — ABNORMAL LOW (ref 22–32)
Calcium: 8.6 mg/dL — ABNORMAL LOW (ref 8.9–10.3)
Chloride: 103 mmol/L (ref 98–111)
Creatinine, Ser: 0.79 mg/dL (ref 0.61–1.24)
GFR calc Af Amer: >60 mL/min (ref >60)
GFR calc non Af Amer: >60 mL/min (ref >60)
Glucose, Bld: 137 mg/dL — ABNORMAL HIGH (ref 70–99)
POTASSIUM: 3.5 mmol/L (ref 3.5–5.1)
Sodium: 136 mmol/L (ref 135–145)

## 2018-09-11 LAB — CBG MONITORING, ED: Glucose-Capillary: 139 mg/dL — ABNORMAL HIGH (ref 70–99)

## 2018-09-11 MED ORDER — SODIUM CHLORIDE 0.9 % IV BOLUS (SEPSIS)
1000.0000 mL | Freq: Once | INTRAVENOUS | Status: AC
  Administered 2018-09-11: 1000 mL via INTRAVENOUS

## 2018-09-11 MED ORDER — TETANUS-DIPHTH-ACELL PERTUSSIS 5-2.5-18.5 LF-MCG/0.5 IM SUSP
0.5000 mL | Freq: Once | INTRAMUSCULAR | Status: AC
  Administered 2018-09-11: 0.5 mL via INTRAMUSCULAR
  Filled 2018-09-11: qty 0.5

## 2018-09-11 NOTE — Discharge Instructions (Addendum)
You may take Tylenol 1000 mg every 6 hours as needed for pain. °

## 2018-09-11 NOTE — ED Triage Notes (Signed)
Pt fell approx 2130 & presents with a posterior head lac.  Pt denies LOC.  Pt stated "I had drank some beer and more than normal."

## 2018-09-11 NOTE — ED Provider Notes (Signed)
TIME SEEN: 12:45 AM  CHIEF COMPLAINT: Head injury, dizziness  HPI: Patient is a 78 year old male with history of CAD, DVT, essential tremor who presents to the emergency department with complaints of a head injury that occurred around 8:30 PM tonight.  States that he was drinking beer and thinks he may have drank too much without eating.  Got up to go to the kitchen to get a sandwich and he felt dizzy and thinks that he tripped and fell striking the back of his head.  There is no loss of consciousness.  He is on aspirin but no anticoagulation.  Denies numbness or focal weakness.  Did vomit several times at home.  Is still feeling dizzy with standing.  Denies any preceding chest pain, shortness of breath, palpitations.  No vomiting, diarrhea prior to this episode.  No fever.  ROS: See HPI Constitutional: no fever  Eyes: no drainage  ENT: no runny nose   Cardiovascular:  no chest pain  Resp: no SOB  GI: no vomiting GU: no dysuria Integumentary: no rash  Allergy: no hives  Musculoskeletal: no leg swelling  Neurological: no slurred speech ROS otherwise negative  PAST MEDICAL HISTORY/PAST SURGICAL HISTORY:  Past Medical History:  Diagnosis Date  . Angina   . Anxiety   . BPH (benign prostatic hyperplasia)   . Coronary artery disease   . Diverticulosis   . DVT (deep venous thrombosis) (HCC) 1990   LLE x2-  left calf is slight larger than right  . Essential tremor   . Heart disease   . History of kidney stones   . Hypercholesterolemia   . RBBB (right bundle branch block with left anterior fascicular block)   . Uveitis    bilateral  . Varicose veins of left lower extremity with pain     MEDICATIONS:  Prior to Admission medications   Medication Sig Start Date End Date Taking? Authorizing Provider  aspirin EC 81 MG tablet Take 81 mg by mouth daily.    [provider]  Multiple Vitamin (MULTIVITAMIN) tablet Take 1 tablet by mouth daily.      [provider]   primidone (MYSOLINE) 50 MG tablet 250 mg in the morning, 200 mg in the evening for one week, THEN 200 mg in the morning, 200 mg in the evening for one week,  THEN 200 mg in the morning, 150 mg in the evening for one week,  THEN 150 mg in the morning, 150 mg in the evening for one week, THEN 150 mg in the morning, 100 mg in the evening for one week, THEN 100 mg in the morning, 100 mg in the evening for one week, THEN 100 mg in the morning, 50 mg in the evening for one week, THEN 50 mg in the morning, 50 mg in the evening for one week, THEN 50 mg in the morning for one week THEN stop medication. 07/17/18   Tat, Eustace Quail, DO  sertraline (ZOLOFT) 50 MG tablet Take 25 mg by mouth daily.     [provider]  simvastatin (ZOCOR) 20 MG tablet Take 20 mg by mouth at bedtime.     [provider]    ALLERGIES:  No Known Allergies  SOCIAL HISTORY:  Social History   Tobacco Use  . Smoking status: Former Smoker    Packs/day: 2.00    Years: 35.00    Pack years: 70.00    Types: Cigarettes    Last attempt to quit: 06/29/1991    Years  since quitting: 27.2  . Smokeless tobacco: Never Used  Substance Use Topics  . Alcohol use: Yes    Comment: 2-3 beers daily    FAMILY HISTORY: No family history on file.  EXAM: BP (!) 150/82 (BP Location: Left Arm)   Pulse 80   Temp 97.7 F (36.5 C) (Oral)   Resp 18   Ht 6' (1.829 m)   Wt 82.6 kg   SpO2 100%   BMI 24.68 kg/m  CONSTITUTIONAL: Alert and oriented and responds appropriately to questions.  Elderly, in no distress HEAD: Normocephalic; abrasion and tenderness to the posterior scalp but no laceration EYES: Conjunctivae clear, PERRL, EOMI ENT: normal nose; no rhinorrhea; moist mucous membranes; pharynx without lesions noted; no dental injury; no septal hematoma NECK: Supple, no meningismus, no LAD; no midline spinal tenderness, step-off or deformity; trachea midline CARD: RRR; S1 and S2 appreciated; no murmurs, no clicks, no  rubs, no gallops RESP: Normal chest excursion without splinting or tachypnea; breath sounds clear and equal bilaterally; no wheezes, no rhonchi, no rales; no hypoxia or respiratory distress CHEST:  chest wall stable, no crepitus or ecchymosis or deformity, nontender to palpation; no flail chest ABD/GI: Normal bowel sounds; non-distended; soft, non-tender, no rebound, no guarding; no ecchymosis or other lesions noted PELVIS:  stable, nontender to palpation BACK:  The back appears normal and is non-tender to palpation, there is no CVA tenderness; no midline spinal tenderness, step-off or deformity EXT: Normal ROM in all joints; non-tender to palpation; no edema; normal capillary refill; no cyanosis, no bony tenderness or bony deformity of patient's extremities, no joint effusion, compartments are soft, extremities are warm and well-perfused, no ecchymosis SKIN: Normal color for age and race; warm NEURO: Moves all extremities equally, normal sensation diffusely, normal speech, cranial nerves II through XII intact, normal gait, has essential tremor in bilateral upper extremities PSYCH: The patient's mood and manner are appropriate. Grooming and personal hygiene are appropriate.  MEDICAL DECISION MAKING: Patient here after a fall.  He thinks that he tripped and thinks this was secondary to drinking too much alcohol today without eating.  Still feels very lightheaded with getting up but is able to ambulate here.  Has an abrasion to the posterior scalp but no laceration that needs repair.  Will update his tetanus vaccination.  Will give IV fluids for his lightheadedness and check EKG, basic labs, urine, CBG.  Will obtain head CT.  No other sign of trauma on exam.  He declines pain medication.  ED PROGRESS: Patient has been monitored in the ED and is doing well.  Labs show leukocytosis which is likely reactive.  Normal electrolytes and blood glucose.  EKG shows right bundle branch block but no arrhythmia or  ischemic change.  He has no chest pain or shortness of breath.  Continues to be neurologically intact and no vomiting.  Able to eat and drink here.  Called lab who verbally gave me urinalysis results over the phone which are unremarkable.  No ketones and no sign of infection.  I feel he is safe to be discharged home.  Discussed head injury return precautions.   At this time, I do not feel there is any life-threatening condition present. I have reviewed and discussed all results (EKG, imaging, lab, urine as appropriate) and exam findings with patient/family. I have reviewed nursing notes and appropriate previous records.  I feel the patient is safe to be discharged home without further emergent workup and can continue workup as an outpatient as  needed. Discussed usual and customary return precautions. Patient/family verbalize understanding and are comfortable with this plan.  Outpatient follow-up has been provided as needed. All questions have been answered.      EKG Interpretation  Date/Time:  Friday September 11 2018 01:17:20 EDT Ventricular Rate:  89 PR Interval:  192 QRS Duration: 136 QT Interval:  408 QTC Calculation: 496 R Axis:   57 Text Interpretation:  Normal sinus rhythm Right bundle branch block that is new compared to previous EKG in 1999 Abnormal ECG Confirmed by Roland Prine, Cyril Mourning [phone]) on 09/11/2018 1:26:39 AM         Lillyonna Armstead, Delice Bison, DO 09/11/18 [id number]

## 2018-09-14 ENCOUNTER — Telehealth: Payer: Self-pay | Admitting: Neurology

## 2018-09-14 NOTE — Telephone Encounter (Signed)
Patient does not have video capability, but can do a phone visit, is that okay?   Best number to reach: [phone].

## 2018-09-14 NOTE — Telephone Encounter (Signed)
No.  A post ER visit that hit head that is still having symptoms needs some type of face to face contact.  Can do urgent visit here or with PCP if he would like but do understand reluctance if he doesn't want to go out.

## 2018-09-14 NOTE — Telephone Encounter (Signed)
It looks like he did have CT scan done in ER on 09/11/18. Please advise.

## 2018-09-14 NOTE — Telephone Encounter (Signed)
Can he do an evisit?

## 2018-09-14 NOTE — Telephone Encounter (Signed)
Tried to call patient but line busy. Will try again later.

## 2018-09-14 NOTE — Telephone Encounter (Signed)
Patient is calling in about last Thursday night tripped and fell and hit back of his head. He had some blood and went to the [facility]. He said he is dizzy today. When he walks hes okay but when he gets up in the morning he is really dizzy.  He is also needing a refill of the Primidone to the pharm on file please. Thanks!

## 2018-09-14 NOTE — Telephone Encounter (Signed)
Called patient and scheduled urgent visit for Wednesday.
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
## 2018-09-16 ENCOUNTER — Other Ambulatory Visit: Payer: Self-pay | Admitting: Neurology

## 2018-09-16 ENCOUNTER — Ambulatory Visit (INDEPENDENT_AMBULATORY_CARE_PROVIDER_SITE_OTHER): Payer: PPO | Admitting: Neurology

## 2018-09-16 ENCOUNTER — Other Ambulatory Visit: Payer: Self-pay

## 2018-09-16 ENCOUNTER — Encounter: Payer: Self-pay | Admitting: Neurology

## 2018-09-16 VITALS — Temp 97.3°F | Ht 72.0 in | Wt 178.0 lb

## 2018-09-16 DIAGNOSIS — F1010 Alcohol abuse, uncomplicated: Secondary | ICD-10-CM | POA: Diagnosis not present

## 2018-09-16 DIAGNOSIS — W19XXXA Unspecified fall, initial encounter: Secondary | ICD-10-CM

## 2018-09-16 DIAGNOSIS — F0781 Postconcussional syndrome: Secondary | ICD-10-CM | POA: Diagnosis not present

## 2018-09-16 DIAGNOSIS — G250 Essential tremor: Secondary | ICD-10-CM | POA: Diagnosis not present

## 2018-09-16 MED ORDER — PRIMIDONE 250 MG PO TABS: 250.0000 mg | ORAL_TABLET | Freq: Two times a day (BID) | ORAL | 1 refills | Status: DC

## 2018-09-16 NOTE — Patient Instructions (Signed)
If new weakness, new falls for unknown reason, new neurologic symptoms then go to the ER.   If morning dizziness does not resolve we can send home health to your home for physical therapy.   Wash the back of your head with baby shampoo.   Decrease and stop alcohol.

## 2018-12-22 DIAGNOSIS — Z125 Encounter for screening for malignant neoplasm of prostate: Secondary | ICD-10-CM | POA: Diagnosis not present

## 2018-12-22 DIAGNOSIS — E7800 Pure hypercholesterolemia, unspecified: Secondary | ICD-10-CM | POA: Diagnosis not present

## 2018-12-29 DIAGNOSIS — Z0000 Encounter for general adult medical examination without abnormal findings: Secondary | ICD-10-CM | POA: Diagnosis not present

## 2018-12-29 DIAGNOSIS — Z955 Presence of coronary angioplasty implant and graft: Secondary | ICD-10-CM | POA: Diagnosis not present

## 2018-12-29 DIAGNOSIS — Z86718 Personal history of other venous thrombosis and embolism: Secondary | ICD-10-CM | POA: Diagnosis not present

## 2018-12-29 DIAGNOSIS — I2510 Atherosclerotic heart disease of native coronary artery without angina pectoris: Secondary | ICD-10-CM | POA: Diagnosis not present

## 2018-12-29 DIAGNOSIS — F339 Major depressive disorder, recurrent, unspecified: Secondary | ICD-10-CM | POA: Diagnosis not present

## 2018-12-29 DIAGNOSIS — I4510 Unspecified right bundle-branch block: Secondary | ICD-10-CM | POA: Diagnosis not present

## 2018-12-29 DIAGNOSIS — I8390 Asymptomatic varicose veins of unspecified lower extremity: Secondary | ICD-10-CM | POA: Diagnosis not present

## 2018-12-29 DIAGNOSIS — Z7982 Long term (current) use of aspirin: Secondary | ICD-10-CM | POA: Diagnosis not present

## 2018-12-29 DIAGNOSIS — G250 Essential tremor: Secondary | ICD-10-CM | POA: Diagnosis not present

## 2018-12-29 DIAGNOSIS — M545 Low back pain: Secondary | ICD-10-CM | POA: Diagnosis not present

## 2018-12-29 DIAGNOSIS — E785 Hyperlipidemia, unspecified: Secondary | ICD-10-CM | POA: Diagnosis not present

## 2018-12-29 DIAGNOSIS — R42 Dizziness and giddiness: Secondary | ICD-10-CM | POA: Diagnosis not present

## 2019-01-06 DIAGNOSIS — M6281 Muscle weakness (generalized): Secondary | ICD-10-CM | POA: Diagnosis not present

## 2019-01-06 DIAGNOSIS — M25562 Pain in left knee: Secondary | ICD-10-CM | POA: Diagnosis not present

## 2019-01-06 DIAGNOSIS — R262 Difficulty in walking, not elsewhere classified: Secondary | ICD-10-CM | POA: Diagnosis not present

## 2019-01-06 DIAGNOSIS — R260 Ataxic gait: Secondary | ICD-10-CM | POA: Diagnosis not present

## 2019-01-12 DIAGNOSIS — M25562 Pain in left knee: Secondary | ICD-10-CM | POA: Diagnosis not present

## 2019-01-12 DIAGNOSIS — R262 Difficulty in walking, not elsewhere classified: Secondary | ICD-10-CM | POA: Diagnosis not present

## 2019-01-12 DIAGNOSIS — M6281 Muscle weakness (generalized): Secondary | ICD-10-CM | POA: Diagnosis not present

## 2019-01-12 DIAGNOSIS — R260 Ataxic gait: Secondary | ICD-10-CM | POA: Diagnosis not present

## 2019-01-14 DIAGNOSIS — M25562 Pain in left knee: Secondary | ICD-10-CM | POA: Diagnosis not present

## 2019-01-14 DIAGNOSIS — M6281 Muscle weakness (generalized): Secondary | ICD-10-CM | POA: Diagnosis not present

## 2019-01-14 DIAGNOSIS — R260 Ataxic gait: Secondary | ICD-10-CM | POA: Diagnosis not present

## 2019-01-14 DIAGNOSIS — R262 Difficulty in walking, not elsewhere classified: Secondary | ICD-10-CM | POA: Diagnosis not present

## 2019-01-18 DIAGNOSIS — R260 Ataxic gait: Secondary | ICD-10-CM | POA: Diagnosis not present

## 2019-01-18 DIAGNOSIS — R262 Difficulty in walking, not elsewhere classified: Secondary | ICD-10-CM | POA: Diagnosis not present

## 2019-01-18 DIAGNOSIS — M25562 Pain in left knee: Secondary | ICD-10-CM | POA: Diagnosis not present

## 2019-01-18 DIAGNOSIS — M6281 Muscle weakness (generalized): Secondary | ICD-10-CM | POA: Diagnosis not present

## 2019-01-20 DIAGNOSIS — M25562 Pain in left knee: Secondary | ICD-10-CM | POA: Diagnosis not present

## 2019-01-20 DIAGNOSIS — R262 Difficulty in walking, not elsewhere classified: Secondary | ICD-10-CM | POA: Diagnosis not present

## 2019-01-20 DIAGNOSIS — R260 Ataxic gait: Secondary | ICD-10-CM | POA: Diagnosis not present

## 2019-01-20 DIAGNOSIS — M6281 Muscle weakness (generalized): Secondary | ICD-10-CM | POA: Diagnosis not present

## 2019-01-25 DIAGNOSIS — M25562 Pain in left knee: Secondary | ICD-10-CM | POA: Diagnosis not present

## 2019-01-25 DIAGNOSIS — R260 Ataxic gait: Secondary | ICD-10-CM | POA: Diagnosis not present

## 2019-01-25 DIAGNOSIS — R262 Difficulty in walking, not elsewhere classified: Secondary | ICD-10-CM | POA: Diagnosis not present

## 2019-01-25 DIAGNOSIS — M6281 Muscle weakness (generalized): Secondary | ICD-10-CM | POA: Diagnosis not present

## 2019-01-28 ENCOUNTER — Ambulatory Visit (INDEPENDENT_AMBULATORY_CARE_PROVIDER_SITE_OTHER): Payer: PPO | Admitting: Neurology

## 2019-01-28 ENCOUNTER — Encounter: Payer: Self-pay | Admitting: Neurology

## 2019-01-28 ENCOUNTER — Other Ambulatory Visit: Payer: Self-pay

## 2019-01-28 VITALS — BP 145/76 | HR 76 | Temp 98.6°F | Ht 71.0 in | Wt 181.8 lb

## 2019-01-28 DIAGNOSIS — G250 Essential tremor: Secondary | ICD-10-CM | POA: Diagnosis not present
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
## 2019-01-29 ENCOUNTER — Other Ambulatory Visit: Payer: Self-pay

## 2019-01-29 MED ORDER — PRIMIDONE 250 MG PO TABS: 250.0000 mg | ORAL_TABLET | Freq: Two times a day (BID) | ORAL | 1 refills | Status: DC

## 2019-01-29 NOTE — Telephone Encounter (Signed)
Requested Prescriptions   Pending Prescriptions Disp Refills  . primidone (MYSOLINE) 250 MG tablet 180 tablet 1    Sig: Take 1 tablet (250 mg total) by mouth 2 (two) times daily.   Rx last filled: 09/16/18 #180 1 refills  Pt last seen: yesterday   Follow up appt scheduled: 08/30/2019

## 2019-02-03 DIAGNOSIS — R262 Difficulty in walking, not elsewhere classified: Secondary | ICD-10-CM | POA: Diagnosis not present

## 2019-02-03 DIAGNOSIS — R260 Ataxic gait: Secondary | ICD-10-CM | POA: Diagnosis not present

## 2019-02-03 DIAGNOSIS — M6281 Muscle weakness (generalized): Secondary | ICD-10-CM | POA: Diagnosis not present

## 2019-02-03 DIAGNOSIS — M25562 Pain in left knee: Secondary | ICD-10-CM | POA: Diagnosis not present

## 2019-02-22 DIAGNOSIS — M9903 Segmental and somatic dysfunction of lumbar region: Secondary | ICD-10-CM | POA: Diagnosis not present

## 2019-02-22 DIAGNOSIS — M5136 Other intervertebral disc degeneration, lumbar region: Secondary | ICD-10-CM | POA: Diagnosis not present

## 2019-02-22 DIAGNOSIS — M545 Low back pain: Secondary | ICD-10-CM | POA: Diagnosis not present

## 2019-02-23 DIAGNOSIS — H1131 Conjunctival hemorrhage, right eye: Secondary | ICD-10-CM | POA: Diagnosis not present

## 2019-02-24 DIAGNOSIS — M5136 Other intervertebral disc degeneration, lumbar region: Secondary | ICD-10-CM | POA: Diagnosis not present

## 2019-02-24 DIAGNOSIS — M545 Low back pain: Secondary | ICD-10-CM | POA: Diagnosis not present

## 2019-02-24 DIAGNOSIS — M9903 Segmental and somatic dysfunction of lumbar region: Secondary | ICD-10-CM | POA: Diagnosis not present

## 2019-02-25 DIAGNOSIS — M545 Low back pain: Secondary | ICD-10-CM | POA: Diagnosis not present

## 2019-02-25 DIAGNOSIS — M5136 Other intervertebral disc degeneration, lumbar region: Secondary | ICD-10-CM | POA: Diagnosis not present

## 2019-02-25 DIAGNOSIS — M9903 Segmental and somatic dysfunction of lumbar region: Secondary | ICD-10-CM | POA: Diagnosis not present

## 2019-03-24 DIAGNOSIS — M545 Low back pain: Secondary | ICD-10-CM | POA: Diagnosis not present

## 2019-03-24 DIAGNOSIS — M5136 Other intervertebral disc degeneration, lumbar region: Secondary | ICD-10-CM | POA: Diagnosis not present

## 2019-03-24 DIAGNOSIS — M9903 Segmental and somatic dysfunction of lumbar region: Secondary | ICD-10-CM | POA: Diagnosis not present

## 2019-03-25 DIAGNOSIS — M545 Low back pain: Secondary | ICD-10-CM | POA: Diagnosis not present

## 2019-03-25 DIAGNOSIS — M9903 Segmental and somatic dysfunction of lumbar region: Secondary | ICD-10-CM | POA: Diagnosis not present

## 2019-03-25 DIAGNOSIS — M5136 Other intervertebral disc degeneration, lumbar region: Secondary | ICD-10-CM | POA: Diagnosis not present

## 2019-03-29 DIAGNOSIS — M5136 Other intervertebral disc degeneration, lumbar region: Secondary | ICD-10-CM | POA: Diagnosis not present

## 2019-03-29 DIAGNOSIS — M9903 Segmental and somatic dysfunction of lumbar region: Secondary | ICD-10-CM | POA: Diagnosis not present

## 2019-03-29 DIAGNOSIS — M545 Low back pain: Secondary | ICD-10-CM | POA: Diagnosis not present

## 2019-03-31 DIAGNOSIS — M545 Low back pain: Secondary | ICD-10-CM | POA: Diagnosis not present

## 2019-03-31 DIAGNOSIS — M5136 Other intervertebral disc degeneration, lumbar region: Secondary | ICD-10-CM | POA: Diagnosis not present

## 2019-03-31 DIAGNOSIS — M9903 Segmental and somatic dysfunction of lumbar region: Secondary | ICD-10-CM | POA: Diagnosis not present

## 2019-04-01 DIAGNOSIS — M9903 Segmental and somatic dysfunction of lumbar region: Secondary | ICD-10-CM | POA: Diagnosis not present

## 2019-04-01 DIAGNOSIS — M545 Low back pain: Secondary | ICD-10-CM | POA: Diagnosis not present

## 2019-04-01 DIAGNOSIS — M5136 Other intervertebral disc degeneration, lumbar region: Secondary | ICD-10-CM | POA: Diagnosis not present

## 2019-04-05 DIAGNOSIS — M545 Low back pain: Secondary | ICD-10-CM | POA: Diagnosis not present

## 2019-04-05 DIAGNOSIS — M5136 Other intervertebral disc degeneration, lumbar region: Secondary | ICD-10-CM | POA: Diagnosis not present

## 2019-04-05 DIAGNOSIS — M9903 Segmental and somatic dysfunction of lumbar region: Secondary | ICD-10-CM | POA: Diagnosis not present

## 2019-04-08 DIAGNOSIS — Z23 Encounter for immunization: Secondary | ICD-10-CM | POA: Diagnosis not present

## 2019-04-08 DIAGNOSIS — M545 Low back pain: Secondary | ICD-10-CM | POA: Diagnosis not present

## 2019-04-08 DIAGNOSIS — M9903 Segmental and somatic dysfunction of lumbar region: Secondary | ICD-10-CM | POA: Diagnosis not present

## 2019-04-08 DIAGNOSIS — M5136 Other intervertebral disc degeneration, lumbar region: Secondary | ICD-10-CM | POA: Diagnosis not present

## 2019-04-27 DIAGNOSIS — M545 Low back pain: Secondary | ICD-10-CM | POA: Diagnosis not present

## 2019-04-27 DIAGNOSIS — M9903 Segmental and somatic dysfunction of lumbar region: Secondary | ICD-10-CM | POA: Diagnosis not present

## 2019-04-27 DIAGNOSIS — M5136 Other intervertebral disc degeneration, lumbar region: Secondary | ICD-10-CM | POA: Diagnosis not present

## 2019-08-27 NOTE — Progress Notes (Signed)
    Joseph Hood was seen today in follow up for essential tremor.  He is more tremulous today and attributes that to the fact that he was [place] [address] which was busy and parking was difficult.  Until today, tremor has been about the same.  My previous records were reviewed prior to todays visit. Pt denies falls.  Pt denies lightheadedness, near syncope.  No hallucinations.  Mood has been good.    Current prescribed movement disorder medications: Primidone, 250 mg twice per day   PREVIOUS MEDICATIONS:Propranolol, unknown dose; primidone; no Topamax due to history of nephrolithiasis x2; gabapentin - 300 mg twice a day caused dizziness  ALLERGIES:  No Known Allergies  CURRENT MEDICATIONS:  Outpatient Encounter Medications as of 08/30/2019  Medication Sig  . amoxicillin (AMOXIL) 500 MG tablet TK 4 T PO 1 HR PRIOR TO DENTAL APPT.  Marland Kitchen aspirin EC 81 MG tablet Take 81 mg by mouth daily.  . Multiple Vitamin (MULTIVITAMIN) tablet Take 1 tablet by mouth daily.    . primidone (MYSOLINE) 250 MG tablet Take 1 tablet (250 mg total) by mouth 2 (two) times daily.  . sertraline (ZOLOFT) 50 MG tablet Take 25 mg by mouth daily.   . simvastatin (ZOCOR) 20 MG tablet Take 20 mg by mouth at bedtime.    No facility-administered encounter medications on file as of 08/30/2019.    PHYSICAL EXAMINATION:    VITALS:  There were no vitals filed for this visit.  GEN:  The patient appears stated age and is in NAD. HEENT:  Normocephalic, atraumatic.  The mucous membranes are moist. The superficial temporal arteries are without ropiness or tenderness. CV:  RRR Lungs:  CTAB Neck/HEME:  There are no carotid bruits bilaterally.  Neurological examination:  Orientation: The patient is alert and oriented x3. Cranial nerves: There is good facial symmetry. The speech is fluent and clear. Soft palate rises symmetrically and there is no tongue deviation. Hearing is intact to conversational tone. Sensation: Sensation is  intact to light touch throughout Motor: Strength is at least antigravity x4.  Movement examination: Tone: There is normal tone in the UE/LE Abnormal movements: Patient has resting tremor in both legs.  He has at least moderate tremor of the outstretched hands bilaterally.  Archimedes spirals are drawn similar to previous. Coordination:  There is no decremation with RAM's, with hand opening and closing her finger taps bilaterally.   ASSESSMENT/PLAN:  1.  Essential Tremor, moderately severe  -Continue primidone, 250 mg twice per day  -Not interested in dbs.  We have discussed focused u/s - he was told in [place] that wasn't a candidate because of PN.  He would like [place] to send a referral to [facility].  We will do that. 2.  PN, likely d/t EtOH  -drinking 1 beer/day now (which is down from 3 per day).  Would like to see the patient wean completely off. 3.  Follow-up 1 year.  Cc:  Deland Pretty, MD

## 2019-08-30 ENCOUNTER — Encounter: Payer: Self-pay | Admitting: Neurology

## 2019-08-30 ENCOUNTER — Ambulatory Visit (INDEPENDENT_AMBULATORY_CARE_PROVIDER_SITE_OTHER): Payer: PPO | Admitting: Neurology

## 2019-08-30 ENCOUNTER — Other Ambulatory Visit: Payer: Self-pay

## 2019-08-30 VITALS — BP 149/83 | HR 69 | Ht 72.0 in | Wt 185.0 lb

## 2019-08-30 DIAGNOSIS — G250 Essential tremor: Secondary | ICD-10-CM

## 2019-08-30 MED ORDER — PRIMIDONE 250 MG PO TABS: 250.0000 mg | ORAL_TABLET | Freq: Two times a day (BID) | ORAL | 3 refills | Status: DC

## 2019-08-30 NOTE — Patient Instructions (Addendum)
I will send a referral to the focused ultrasound to [facility]  The physicians and staff at [facility] are committed to providing excellent care. You may receive a survey requesting feedback about your experience at our office. We strive to receive "very good" responses to the survey questions. If you feel that your experience would prevent you from giving the office a "very good " response, please contact our office to try to remedy the situation. We may be reached at [phone]. Thank you for taking the time out of your busy day to complete the survey.

## 2019-08-31 DIAGNOSIS — Z85820 Personal history of malignant melanoma of skin: Secondary | ICD-10-CM | POA: Diagnosis not present

## 2019-08-31 DIAGNOSIS — D229 Melanocytic nevi, unspecified: Secondary | ICD-10-CM | POA: Diagnosis not present

## 2019-08-31 DIAGNOSIS — D485 Neoplasm of uncertain behavior of skin: Secondary | ICD-10-CM | POA: Diagnosis not present

## 2019-08-31 DIAGNOSIS — L570 Actinic keratosis: Secondary | ICD-10-CM | POA: Diagnosis not present

## 2019-08-31 DIAGNOSIS — L821 Other seborrheic keratosis: Secondary | ICD-10-CM | POA: Diagnosis not present

## 2019-09-10 ENCOUNTER — Telehealth: Payer: Self-pay | Admitting: Dermatology

## 2019-09-10 NOTE — Telephone Encounter (Signed)
Patient left message on lunchtime voicemail.  He would like to get his patholology results from his last visit with [facility].  Chart # [id number]

## 2019-09-10 NOTE — Telephone Encounter (Signed)
Called patient and gave pathology results.

## 2019-10-04 DIAGNOSIS — H43813 Vitreous degeneration, bilateral: Secondary | ICD-10-CM | POA: Diagnosis not present

## 2019-10-15 ENCOUNTER — Other Ambulatory Visit: Payer: Self-pay

## 2019-10-15 ENCOUNTER — Other Ambulatory Visit: Payer: Self-pay | Admitting: Neurology

## 2019-10-15 MED ORDER — PRIMIDONE 250 MG PO TABS: 250.0000 mg | ORAL_TABLET | Freq: Two times a day (BID) | ORAL | 0 refills | Status: DC

## 2019-10-15 NOTE — Telephone Encounter (Signed)
3 day supply of medication sent to pharmacy in [place]

## 2019-10-15 NOTE — Telephone Encounter (Signed)
You may provide an emergency supply of medication for 3 days of primidone

## 2019-10-15 NOTE — Telephone Encounter (Signed)
Pt daughter called and states that the patient did not bring enough medication with him out of town and would like [place] to call in 3 days worth of primidone to the [facility] @ [phone]

## 2019-12-29 DIAGNOSIS — E7800 Pure hypercholesterolemia, unspecified: Secondary | ICD-10-CM | POA: Diagnosis not present

## 2019-12-29 DIAGNOSIS — Z125 Encounter for screening for malignant neoplasm of prostate: Secondary | ICD-10-CM | POA: Diagnosis not present

## 2020-01-05 DIAGNOSIS — N400 Enlarged prostate without lower urinary tract symptoms: Secondary | ICD-10-CM | POA: Diagnosis not present

## 2020-01-05 DIAGNOSIS — G4762 Sleep related leg cramps: Secondary | ICD-10-CM | POA: Diagnosis not present

## 2020-01-05 DIAGNOSIS — G250 Essential tremor: Secondary | ICD-10-CM | POA: Diagnosis not present

## 2020-01-05 DIAGNOSIS — I4510 Unspecified right bundle-branch block: Secondary | ICD-10-CM | POA: Diagnosis not present

## 2020-01-05 DIAGNOSIS — Z0001 Encounter for general adult medical examination with abnormal findings: Secondary | ICD-10-CM | POA: Diagnosis not present

## 2020-01-05 DIAGNOSIS — E7800 Pure hypercholesterolemia, unspecified: Secondary | ICD-10-CM | POA: Diagnosis not present

## 2020-01-05 DIAGNOSIS — R61 Generalized hyperhidrosis: Secondary | ICD-10-CM | POA: Diagnosis not present

## 2020-01-05 DIAGNOSIS — I8390 Asymptomatic varicose veins of unspecified lower extremity: Secondary | ICD-10-CM | POA: Diagnosis not present

## 2020-01-05 DIAGNOSIS — R2689 Other abnormalities of gait and mobility: Secondary | ICD-10-CM | POA: Diagnosis not present

## 2020-01-05 DIAGNOSIS — E785 Hyperlipidemia, unspecified: Secondary | ICD-10-CM | POA: Diagnosis not present

## 2020-01-05 DIAGNOSIS — I2510 Atherosclerotic heart disease of native coronary artery without angina pectoris: Secondary | ICD-10-CM | POA: Diagnosis not present

## 2020-01-05 DIAGNOSIS — Z125 Encounter for screening for malignant neoplasm of prostate: Secondary | ICD-10-CM | POA: Diagnosis not present

## 2020-01-05 DIAGNOSIS — R7303 Prediabetes: Secondary | ICD-10-CM | POA: Diagnosis not present

## 2020-03-07 DIAGNOSIS — Z20828 Contact with and (suspected) exposure to other viral communicable diseases: Secondary | ICD-10-CM | POA: Diagnosis not present

## 2020-03-11 ENCOUNTER — Other Ambulatory Visit (HOSPITAL_COMMUNITY): Payer: Self-pay | Admitting: Nurse Practitioner

## 2020-03-11 DIAGNOSIS — I2510 Atherosclerotic heart disease of native coronary artery without angina pectoris: Secondary | ICD-10-CM

## 2020-03-11 DIAGNOSIS — R54 Age-related physical debility: Secondary | ICD-10-CM

## 2020-03-11 DIAGNOSIS — U071 COVID-19: Secondary | ICD-10-CM

## 2020-03-11 NOTE — Progress Notes (Signed)
I connected by phone with Joseph Hood on 03/11/2020 at 11:31 AM to discuss the potential use of a new treatment for mild to moderate COVID-19 viral infection in non-hospitalized patients.  This patient is a 79 y.o. male that meets the FDA criteria for Emergency Use Authorization of COVID monoclonal antibody casirivimab/imdevimab.  Has a (+) direct SARS-CoV-2 viral test result  Has mild or moderate COVID-19   Is NOT hospitalized due to COVID-19  Is within 10 days of symptom onset  Has at least one of the high risk factor(s) for progression to severe COVID-19 and/or hospitalization as defined in EUA.  Specific high risk criteria : Older age (>/= 80 yo) and Cardiovascular disease or hypertension   I have spoken and communicated the following to the patient or parent/caregiver regarding COVID monoclonal antibody treatment:  1. FDA has authorized the emergency use for the treatment of mild to moderate COVID-19 in adults and pediatric patients with positive results of direct SARS-CoV-2 viral testing who are 43 years of age and older weighing at least 40 kg, and who are at high risk for progressing to severe COVID-19 and/or hospitalization.  2. The significant known and potential risks and benefits of COVID monoclonal antibody, and the extent to which such potential risks and benefits are unknown.  3. Information on available alternative treatments and the risks and benefits of those alternatives, including clinical trials.  4. Patients treated with COVID monoclonal antibody should continue to self-isolate and use infection control measures (e.g., wear mask, isolate, social distance, avoid sharing personal items, clean and disinfect high touch surfaces, and frequent handwashing) according to CDC guidelines.   5. The patient or parent/caregiver has the option to accept or refuse COVID monoclonal antibody treatment.  After reviewing this information with the patient, The patient agreed to  proceed with receiving casirivimab\imdevimab infusion and will be provided a copy of the Fact sheet prior to receiving the infusion. [name] 03/11/2020 11:31 AM

## 2020-03-12 ENCOUNTER — Ambulatory Visit (HOSPITAL_COMMUNITY)
Admission: RE | Admit: 2020-03-12 | Discharge: 2020-03-12 | Disposition: A | Discharge status: Home / Self Care | Payer: Medicare Other | Source: Ambulatory Visit | Attending: Pulmonary Disease | Admitting: Pulmonary Disease

## 2020-03-12 ENCOUNTER — Other Ambulatory Visit (HOSPITAL_COMMUNITY): Payer: Self-pay

## 2020-03-12 DIAGNOSIS — U071 COVID-19: Secondary | ICD-10-CM | POA: Diagnosis present

## 2020-03-12 DIAGNOSIS — Z23 Encounter for immunization: Secondary | ICD-10-CM | POA: Insufficient documentation

## 2020-03-12 DIAGNOSIS — R54 Age-related physical debility: Secondary | ICD-10-CM | POA: Diagnosis present

## 2020-03-12 DIAGNOSIS — I2510 Atherosclerotic heart disease of native coronary artery without angina pectoris: Secondary | ICD-10-CM | POA: Insufficient documentation

## 2020-03-12 MED ORDER — FAMOTIDINE IN NACL 20-0.9 MG/50ML-% IV SOLN: 20.0000 mg | Freq: Once | INTRAVENOUS | Status: DC | PRN

## 2020-03-12 MED ORDER — SODIUM CHLORIDE 0.9 % IV SOLN
1200.0000 mg | Freq: Once | INTRAVENOUS | Status: AC
  Administered 2020-03-12: 1200 mg via INTRAVENOUS

## 2020-03-12 MED ORDER — METHYLPREDNISOLONE SODIUM SUCC 125 MG IJ SOLR: 125.0000 mg | Freq: Once | INTRAMUSCULAR | Status: DC | PRN

## 2020-03-12 MED ORDER — SODIUM CHLORIDE 0.9 % IV SOLN: INTRAVENOUS | Status: DC | PRN

## 2020-03-12 MED ORDER — EPINEPHRINE 0.3 MG/0.3ML IJ SOAJ: 0.3000 mg | Freq: Once | INTRAMUSCULAR | Status: DC | PRN

## 2020-03-12 MED ORDER — ALBUTEROL SULFATE HFA 108 (90 BASE) MCG/ACT IN AERS: 2.0000 | INHALATION_SPRAY | Freq: Once | RESPIRATORY_TRACT | Status: DC | PRN

## 2020-03-12 MED ORDER — DIPHENHYDRAMINE HCL 50 MG/ML IJ SOLN: 50.0000 mg | Freq: Once | INTRAMUSCULAR | Status: DC | PRN

## 2020-03-12 NOTE — Progress Notes (Signed)
  Diagnosis: COVID-19  Physician: Dr. Joya Gaskins  Procedure: [facility] Infusion Clinic Med: casirivimab\imdevimab infusion - Provided patient with casirivimab\imdevimab fact sheet for patients, parents and caregivers prior to infusion.  Complications: No immediate complications noted.  Discharge: Discharged home   Ludwig Lean 03/12/2020

## 2020-03-12 NOTE — Discharge Instructions (Signed)

## 2020-03-27 ENCOUNTER — Other Ambulatory Visit: Payer: Self-pay

## 2020-03-27 ENCOUNTER — Encounter: Payer: Self-pay | Admitting: Dermatology

## 2020-03-27 ENCOUNTER — Ambulatory Visit (INDEPENDENT_AMBULATORY_CARE_PROVIDER_SITE_OTHER): Payer: PPO | Admitting: Dermatology

## 2020-03-27 DIAGNOSIS — Z1283 Encounter for screening for malignant neoplasm of skin: Secondary | ICD-10-CM | POA: Diagnosis not present

## 2020-03-27 DIAGNOSIS — L820 Inflamed seborrheic keratosis: Secondary | ICD-10-CM

## 2020-03-27 DIAGNOSIS — Z85828 Personal history of other malignant neoplasm of skin: Secondary | ICD-10-CM

## 2020-03-27 DIAGNOSIS — B079 Viral wart, unspecified: Secondary | ICD-10-CM | POA: Diagnosis not present

## 2020-03-27 DIAGNOSIS — D485 Neoplasm of uncertain behavior of skin: Secondary | ICD-10-CM | POA: Diagnosis not present

## 2020-03-27 DIAGNOSIS — D489 Neoplasm of uncertain behavior, unspecified: Secondary | ICD-10-CM

## 2020-03-27 NOTE — Patient Instructions (Addendum)
Routine follow-up for an old friend Laurie Lovejoy date of birth 1940-10-05.  He has had slow enlargement of a pink crust on the left bulb of the nose.  This could represent a small skin cancer but I favor an inflamed keratosis or wart.  Shave biopsy did not show any deep involvement so the base was lightly cauterized.  There are no restrictions on activity.  A little dab of Vaseline or Neosporin after bathing for a week is reasonable.  He also has mildly irritated tan 1 cm scale on the right shin which is a seborrheic keratosis.  No freezing or biopsy is necessary.  The remainder of his skin examination showed no atypical moles, melanoma, or nonmole skin cancer. follow-up by telephone Friday or Monday to discuss the biopsy results  Biopsy, Surgery (Curettage) & Surgery (Excision) Aftercare  Instructions  1. Okay to remove bandage in 24 hours  2. Wash area with soap and water  3. Apply Vaseline to area twice daily until healed (Not Neosporin)  4. Okay to cover with a Band-Aid to decrease the chance of infection or prevent irritation from clothing; also it's okay to uncover lesion at home.  5. Suture instructions: return to our office in 7-10 or 10-14 days for a nurse visit for suture removal. Variable healing with sutures, if pain or itching occurs call our office. It's okay to shower or bathe 24 hours after sutures are given.  6. The following risks may occur after a biopsy, curettage or excision: bleeding, scarring, discoloration, recurrence, infection (redness, yellow drainage, pain or swelling).  7. For questions, concerns and results call our office at [place] before 4pm & Friday before 3pm. Biopsy results will be available in 1 week.

## 2020-04-03 ENCOUNTER — Telehealth: Payer: Self-pay | Admitting: Dermatology

## 2020-04-03 NOTE — Telephone Encounter (Signed)
Patient is calling for pathology results from last visit with Stuart Tafeen, MD 

## 2020-04-03 NOTE — Telephone Encounter (Signed)
Path to patient  

## 2020-04-04 DIAGNOSIS — Z23 Encounter for immunization: Secondary | ICD-10-CM | POA: Diagnosis not present

## 2020-04-12 DIAGNOSIS — H26492 Other secondary cataract, left eye: Secondary | ICD-10-CM | POA: Diagnosis not present

## 2020-04-12 DIAGNOSIS — H43813 Vitreous degeneration, bilateral: Secondary | ICD-10-CM | POA: Diagnosis not present

## 2020-04-12 DIAGNOSIS — H52203 Unspecified astigmatism, bilateral: Secondary | ICD-10-CM | POA: Diagnosis not present

## 2020-04-12 DIAGNOSIS — H18593 Other hereditary corneal dystrophies, bilateral: Secondary | ICD-10-CM | POA: Diagnosis not present

## 2020-04-30 ENCOUNTER — Encounter: Payer: Self-pay | Admitting: Dermatology

## 2020-04-30 NOTE — Progress Notes (Signed)
   Follow-Up Visit   Subjective  Joseph Hood is a 79 y.o. male who presents for the following: Follow-up (pink scale on nose x 6 weeks  no beding).  Growth Location: Left side of nose Duration:  Quality:  Associated Signs/Symptoms: Modifying Factors:  Severity:  Timing: Context: History of nonmelanoma skin cancers  Objective  Well appearing patient in no apparent distress; mood and affect are within normal limits.  A full examination was performed including scalp, head, eyes, ears, nose, lips, neck, chest, axillae, abdomen, back, buttocks, bilateral upper extremities, bilateral lower extremities, hands, feet, fingers, toes, fingernails, and toenails. All findings within normal limits unless otherwise noted below.   Assessment & Plan    Neoplasm of uncertain behavior Left Supratip of Nose  Skin / nail biopsy Type of biopsy: tangential   Informed consent: discussed and consent obtained   Timeout: patient name, date of birth, surgical site, and procedure verified   Anesthesia: the lesion was anesthetized in a standard fashion   Anesthetic:  1% lidocaine w/ epinephrine 1-100,000 local infiltration Instrument used: flexible razor blade   Hemostasis achieved with: ferric subsulfate   Outcome: patient tolerated procedure well   Post-procedure details: sterile dressing applied and wound care instructions given   Dressing type: bandage and petrolatum    Specimen 1 - Surgical pathology Differential Diagnosis: Wart BCC SCC Check Margins: No  Base cauterized after shave biopsy  Inflamed seborrheic keratosis Right Lower Leg - Anterior  I told Liliane Channel that this can be hard to distinguish from a superficial carcinoma, but this would not be a high risk lesion.  Return for biopsy if grows or bleeds.  Encounter for screening for malignant neoplasm of skin Mid Back  Annual skin examination.   Routine follow-up for an old friend Joseph Hood date of birth 29-Apr-1941.  He has had slow enlargement of a pink crust on the left bulb of the nose.  This could represent a small skin cancer but I favor an inflamed keratosis or wart.  Shave biopsy did not show any deep involvement so the base was lightly cauterized.  There are no restrictions on activity.  A little dab of Vaseline or Neosporin after bathing for a week is reasonable.  He also has mildly irritated tan 1 cm scale on the right shin which is a seborrheic keratosis.  No freezing or biopsy is necessary.  The remainder of his skin examination showed no atypical moles, melanoma, or nonmole skin cancer. follow-up by telephone Friday or Monday to discuss the biopsy results            I, Lavonna Monarch, MD, have reviewed all documentation for this visit.  The documentation on 04/30/20 for the exam, diagnosis, procedures, and orders are all accurate and complete.

## 2020-05-15 DIAGNOSIS — H5711 Ocular pain, right eye: Secondary | ICD-10-CM | POA: Diagnosis not present

## 2020-06-01 DIAGNOSIS — H26492 Other secondary cataract, left eye: Secondary | ICD-10-CM | POA: Diagnosis not present

## 2020-08-02 DIAGNOSIS — T1512XA Foreign body in conjunctival sac, left eye, initial encounter: Secondary | ICD-10-CM | POA: Diagnosis not present

## 2020-08-02 DIAGNOSIS — H1132 Conjunctival hemorrhage, left eye: Secondary | ICD-10-CM | POA: Diagnosis not present

## 2020-08-21 DIAGNOSIS — M9903 Segmental and somatic dysfunction of lumbar region: Secondary | ICD-10-CM | POA: Diagnosis not present

## 2020-08-21 DIAGNOSIS — M5136 Other intervertebral disc degeneration, lumbar region: Secondary | ICD-10-CM | POA: Diagnosis not present

## 2020-08-23 DIAGNOSIS — M9903 Segmental and somatic dysfunction of lumbar region: Secondary | ICD-10-CM | POA: Diagnosis not present

## 2020-08-23 DIAGNOSIS — M5136 Other intervertebral disc degeneration, lumbar region: Secondary | ICD-10-CM | POA: Diagnosis not present

## 2020-08-24 DIAGNOSIS — M5136 Other intervertebral disc degeneration, lumbar region: Secondary | ICD-10-CM | POA: Diagnosis not present

## 2020-08-24 DIAGNOSIS — M9903 Segmental and somatic dysfunction of lumbar region: Secondary | ICD-10-CM | POA: Diagnosis not present

## 2020-08-28 DIAGNOSIS — M9903 Segmental and somatic dysfunction of lumbar region: Secondary | ICD-10-CM | POA: Diagnosis not present

## 2020-08-28 DIAGNOSIS — M5136 Other intervertebral disc degeneration, lumbar region: Secondary | ICD-10-CM | POA: Diagnosis not present

## 2020-08-28 NOTE — Progress Notes (Signed)
Assessment/Plan:    1.  Essential Tremor  -Continue primidone, 250 mg twice per day.  He knows that he has no other options medically  -He is not interested in DBS.  He has been told by [facility] that he was not a candidate for focused ultrasound because of the peripheral neuropathy (presumably because of loss of balance associated with focused ultrasound as well as peripheral neuropathy).  Subjective:   Joseph Hood was seen today in follow up for essential tremor.  My previous records were reviewed prior to todays visit. Pt denies falls.  Pt states that he still significant tremor. Last visit, the patient wanted an evaluation at [facility] for possible focused ultrasound (even though [facility] had already told him he was not a candidate).  I searched care everywhere and it does not appear that he ever went for consultation.  States today that he thinks that he is still on the list.  He states that he had covid in sept and he still has loss of taste.    Current prescribed movement disorder medications: Primidone, 250 mg twice per day   PREVIOUS MEDICATIONS:Propranolol, unknown dose; primidone; no Topamax due to history of nephrolithiasis x2; gabapentin - 300 mg twice a day caused dizziness  ALLERGIES:  No Known Allergies  CURRENT MEDICATIONS:  Outpatient Encounter Medications as of 08/29/2020  Medication Sig  . amoxicillin (AMOXIL) 500 MG tablet TK 4 T PO 1 HR PRIOR TO DENTAL APPT.  Marland Kitchen aspirin EC 81 MG tablet Take 81 mg by mouth daily.  . Multiple Vitamin (MULTIVITAMIN) tablet Take 1 tablet by mouth daily.  . primidone (MYSOLINE) 250 MG tablet Take 1 tablet (250 mg total) by mouth in the morning and at bedtime.  . sertraline (ZOLOFT) 50 MG tablet Take 25 mg by mouth daily.   . simvastatin (ZOCOR) 20 MG tablet Take 20 mg by mouth at bedtime.  . primidone (MYSOLINE) 250 MG tablet Take 1 tablet (250 mg total) by mouth 2 (two) times daily. (Patient not taking: Reported on 08/29/2020)   No  facility-administered encounter medications on file as of 08/29/2020.     Objective:    PHYSICAL EXAMINATION:    VITALS:   Vitals:   08/29/20 1248  BP: 120/74  Pulse: 82  SpO2: 98%  Weight: 190 lb (86.2 kg)  Height: 6' (1.829 m)    GEN:  The patient appears stated age and is in NAD. HEENT:  Normocephalic, atraumatic.  The mucous membranes are moist. The superficial temporal arteries are without ropiness or tenderness. CV:  RRR Lungs:  CTAB Neck/HEME:  There are no carotid bruits bilaterally.  Neurological examination:  Orientation: The patient is alert and oriented x3. Cranial nerves: There is good facial symmetry. The speech is fluent and clear. Soft palate rises symmetrically and there is no tongue deviation. Hearing is intact to conversational tone. Sensation: Sensation is intact to light touch throughout Motor: Strength is at least antigravity x4.  Movement examination: Tone: There is normal tone in the UE/LE Abnormal movements: Patient has resting tremor in both legs.  He has at least moderate tremor of the outstretched hands bilaterally.  Archimedes spirals are drawn similar to previous. Coordination:  There is no decremation with RAM's, with hand opening and closing her finger taps bilaterally. Gait and station:  He is just slightly unbalanced when he first stands and is better the longer he walks. I have reviewed and interpreted the following labs independently   Chemistry  Component Value Date/Time   NA 136 09/11/2018 0115   K 3.5 09/11/2018 0115   CL 103 09/11/2018 0115   CO2 20 (L) 09/11/2018 0115   BUN 13 09/11/2018 0115   CREATININE 0.79 09/11/2018 0115      Component Value Date/Time   CALCIUM 8.6 (L) 09/11/2018 0115      Lab Results  Component Value Date   WBC 15.4 (H) 09/11/2018   HGB 15.8 09/11/2018   HCT 48.7 09/11/2018   MCV 95.9 09/11/2018   PLT 178 09/11/2018   No results found for: TSH   Chemistry      Component Value Date/Time    NA 136 09/11/2018 0115   K 3.5 09/11/2018 0115   CL 103 09/11/2018 0115   CO2 20 (L) 09/11/2018 0115   BUN 13 09/11/2018 0115   CREATININE 0.79 09/11/2018 0115      Component Value Date/Time   CALCIUM 8.6 (L) 09/11/2018 0115         Total time spent on today's visit was 20 minutes, including both face-to-face time and nonface-to-face time.  Time included that spent on review of records (prior notes available to me/labs/imaging if pertinent), discussing treatment and goals, answering patient's questions and coordinating care.  Cc:  Deland Pretty, MD

## 2020-08-29 ENCOUNTER — Ambulatory Visit: Payer: PPO | Admitting: Neurology

## 2020-08-29 ENCOUNTER — Other Ambulatory Visit: Payer: Self-pay

## 2020-08-29 ENCOUNTER — Encounter: Payer: Self-pay | Admitting: Neurology

## 2020-08-29 VITALS — BP 120/74 | HR 82 | Ht 72.0 in | Wt 190.0 lb

## 2020-08-29 DIAGNOSIS — G250 Essential tremor: Secondary | ICD-10-CM

## 2020-08-29 NOTE — Patient Instructions (Signed)
Good to see you today!  The physicians and staff at [facility] are committed to providing excellent care. You may receive a survey requesting feedback about your experience at our office. We strive to receive "very good" responses to the survey questions. If you feel that your experience would prevent you from giving the office a "very good " response, please contact our office to try to remedy the situation. We may be reached at [phone]. Thank you for taking the time out of your busy day to complete the survey.     

## 2020-10-02 ENCOUNTER — Telehealth: Payer: Self-pay | Admitting: Neurology

## 2020-10-03 ENCOUNTER — Telehealth: Payer: Self-pay

## 2020-10-03 MED ORDER — PRIMIDONE 250 MG PO TABS: 250.0000 mg | ORAL_TABLET | Freq: Two times a day (BID) | ORAL | 2 refills | Status: DC

## 2020-10-03 NOTE — Telephone Encounter (Signed)
error 

## 2020-10-03 NOTE — Telephone Encounter (Addendum)
Patient came to the office today stating he called yesterday for a refill and never received a call back and his medication was not sent to the pharmacy.   Dr Tat made aware and agreeable with rx refill.   Reviewed patients chart and informed patient that I did not get a message from him and we did not receive anything from the pharmacy.   Rx(s) sent to pharmacy electronically.  Informed patient that we have sent his rx to the pharmacy and I apologized for the confusion. He voiced understanding.

## 2021-01-09 DIAGNOSIS — E7801 Familial hypercholesterolemia: Secondary | ICD-10-CM | POA: Diagnosis not present

## 2021-01-09 DIAGNOSIS — R7303 Prediabetes: Secondary | ICD-10-CM | POA: Diagnosis not present

## 2021-01-16 DIAGNOSIS — N400 Enlarged prostate without lower urinary tract symptoms: Secondary | ICD-10-CM | POA: Diagnosis not present

## 2021-01-16 DIAGNOSIS — F419 Anxiety disorder, unspecified: Secondary | ICD-10-CM | POA: Diagnosis not present

## 2021-01-16 DIAGNOSIS — F339 Major depressive disorder, recurrent, unspecified: Secondary | ICD-10-CM | POA: Diagnosis not present

## 2021-01-16 DIAGNOSIS — I2510 Atherosclerotic heart disease of native coronary artery without angina pectoris: Secondary | ICD-10-CM | POA: Diagnosis not present

## 2021-01-16 DIAGNOSIS — Z0001 Encounter for general adult medical examination with abnormal findings: Secondary | ICD-10-CM | POA: Diagnosis not present

## 2021-01-16 DIAGNOSIS — R2689 Other abnormalities of gait and mobility: Secondary | ICD-10-CM | POA: Diagnosis not present

## 2021-01-16 DIAGNOSIS — R7303 Prediabetes: Secondary | ICD-10-CM | POA: Diagnosis not present

## 2021-01-16 DIAGNOSIS — E785 Hyperlipidemia, unspecified: Secondary | ICD-10-CM | POA: Diagnosis not present

## 2021-01-16 DIAGNOSIS — G252 Other specified forms of tremor: Secondary | ICD-10-CM | POA: Diagnosis not present

## 2021-03-12 ENCOUNTER — Other Ambulatory Visit: Payer: Self-pay

## 2021-03-12 ENCOUNTER — Ambulatory Visit: Payer: PPO | Admitting: Dermatology

## 2021-03-12 ENCOUNTER — Encounter: Payer: Self-pay | Admitting: Dermatology

## 2021-03-12 DIAGNOSIS — D485 Neoplasm of uncertain behavior of skin: Secondary | ICD-10-CM

## 2021-03-12 DIAGNOSIS — L821 Other seborrheic keratosis: Secondary | ICD-10-CM

## 2021-03-12 DIAGNOSIS — L570 Actinic keratosis: Secondary | ICD-10-CM | POA: Diagnosis not present

## 2021-03-12 DIAGNOSIS — L820 Inflamed seborrheic keratosis: Secondary | ICD-10-CM | POA: Diagnosis not present

## 2021-03-12 DIAGNOSIS — L729 Follicular cyst of the skin and subcutaneous tissue, unspecified: Secondary | ICD-10-CM | POA: Diagnosis not present

## 2021-03-12 DIAGNOSIS — Z1283 Encounter for screening for malignant neoplasm of skin: Secondary | ICD-10-CM

## 2021-03-12 NOTE — Patient Instructions (Signed)

## 2021-03-25 ENCOUNTER — Encounter: Payer: Self-pay | Admitting: Dermatology

## 2021-03-25 NOTE — Progress Notes (Signed)
   Follow-Up Visit   Subjective  Joseph Hood is a 80 y.o. male who presents for the following: Skin Problem (3 spots I want checked on my face- 2 feel like they are under the skin & one is like a pimple).  Crust on the right cheek and left arm) inflamed cyst on cheek. Location:  Duration:  Quality:  Associated Signs/Symptoms: Modifying Factors:  Severity:  Timing: Context:   Objective  Well appearing patient in no apparent distress; mood and affect are within normal limits. Right Mid Mandible 7 mm waxy pink crust, possible superficial carcinoma in       Left Buccal Cheek Brown textured 7 mm flattopped papule; dermoscopy confirms.  Left Malar Cheek Inflamed 3 mm epidermoid cyst  Right Forearm - Posterior Hornlike pink 3 mm crust    All skin waist up examined.   Assessment & Plan    Neoplasm of uncertain behavior of skin Right Mid Mandible  Skin / nail biopsy Type of biopsy: tangential   Informed consent: discussed and consent obtained   Timeout: patient name, date of birth, surgical site, and procedure verified   Procedure prep:  Patient was prepped and draped in usual sterile fashion (Non sterile) Prep type:  Chlorhexidine Anesthesia: the lesion was anesthetized in a standard fashion   Anesthetic:  1% lidocaine w/ epinephrine 1-100,000 local infiltration Instrument used: flexible razor blade   Outcome: patient tolerated procedure well   Post-procedure details: wound care instructions given    Destruction of lesion  Specimen 1 - Surgical pathology Differential Diagnosis: R/O BCC vs SCC  Check Margins: No  After shave biopsy the base was treated by single curettage plus light cautery.  Seborrheic keratosis Left Buccal Cheek  Benign okay to leave if stable.  Cyst of skin Left Malar Cheek  Incision and Drainage - Left Malar Cheek Location: Left Malar Cheek  Informed Consent: Discussed risks (permanent scarring, light or dark discoloration,  infection, pain, bleeding, bruising, redness, damage to adjacent structures, and recurrence of the lesion) and benefits of the procedure, as well as the alternatives.  Informed consent was obtained.  Preparation: The area was prepped with alcohol.  Anesthesia: Lidocaine 2% with epinephrine  Procedure Details: An incision was made overlying the lesion. The lesion drained partially liquefied keratin.    Antibiotic ointment and a sterile pressure dressing were applied. The patient tolerated procedure well.  Total number of lesions drained: 1  Plan: The patient was instructed on post-op care. Recommend OTC analgesia as needed for pain.   AK (actinic keratosis) Right Forearm - Posterior  Destruction of lesion - Right Forearm - Posterior Complexity: simple   Destruction method: cryotherapy   Informed consent: discussed and consent obtained   Timeout:  patient name, date of birth, surgical site, and procedure verified Lesion destroyed using liquid nitrogen: Yes   Cryotherapy cycles:  5 Outcome: patient tolerated procedure well with no complications   Post-procedure details: wound care instructions given        I, Lavonna Monarch, MD, have reviewed all documentation for this visit.  The documentation on 03/25/21 for the exam, diagnosis, procedures, and orders are all accurate and complete.

## 2021-03-26 DIAGNOSIS — Z23 Encounter for immunization: Secondary | ICD-10-CM | POA: Diagnosis not present

## 2021-03-26 DIAGNOSIS — R42 Dizziness and giddiness: Secondary | ICD-10-CM | POA: Diagnosis not present

## 2021-03-27 DIAGNOSIS — M25562 Pain in left knee: Secondary | ICD-10-CM | POA: Diagnosis not present

## 2021-03-27 DIAGNOSIS — R260 Ataxic gait: Secondary | ICD-10-CM | POA: Diagnosis not present

## 2021-03-27 DIAGNOSIS — M6281 Muscle weakness (generalized): Secondary | ICD-10-CM | POA: Diagnosis not present

## 2021-03-27 DIAGNOSIS — R262 Difficulty in walking, not elsewhere classified: Secondary | ICD-10-CM | POA: Diagnosis not present

## 2021-05-08 DIAGNOSIS — Z955 Presence of coronary angioplasty implant and graft: Secondary | ICD-10-CM | POA: Diagnosis not present

## 2021-05-08 DIAGNOSIS — E785 Hyperlipidemia, unspecified: Secondary | ICD-10-CM | POA: Diagnosis not present

## 2021-05-08 DIAGNOSIS — R7303 Prediabetes: Secondary | ICD-10-CM | POA: Diagnosis not present

## 2021-05-25 DIAGNOSIS — H6121 Impacted cerumen, right ear: Secondary | ICD-10-CM | POA: Diagnosis not present

## 2021-05-25 DIAGNOSIS — H938X1 Other specified disorders of right ear: Secondary | ICD-10-CM | POA: Diagnosis not present

## 2021-06-07 ENCOUNTER — Telehealth: Payer: Self-pay | Admitting: Neurology

## 2021-06-07 NOTE — Telephone Encounter (Signed)
Pt called in stating each year around the holidays, his refills of his Primidone runs out and we are closed for the holiday. He knows it's not time for him to get them now, he just wants it to be ready so he doesn't have to get an emergency amount until we come back into the office. He would like a call to confirm if this can be done.

## 2021-06-08 ENCOUNTER — Other Ambulatory Visit: Payer: Self-pay

## 2021-06-08 MED ORDER — PRIMIDONE 250 MG PO TABS
250.0000 mg | ORAL_TABLET | Freq: Two times a day (BID) | ORAL | 0 refills | Status: DC
Start: 2021-06-08 — End: 2021-08-30

## 2021-06-08 NOTE — Telephone Encounter (Signed)
Pt called an informed that prescription is there at pharmacy when he needs it, pt was thankful ,

## 2021-07-13 DIAGNOSIS — H43813 Vitreous degeneration, bilateral: Secondary | ICD-10-CM | POA: Diagnosis not present

## 2021-07-13 DIAGNOSIS — H18593 Other hereditary corneal dystrophies, bilateral: Secondary | ICD-10-CM | POA: Diagnosis not present

## 2021-07-13 DIAGNOSIS — H52203 Unspecified astigmatism, bilateral: Secondary | ICD-10-CM | POA: Diagnosis not present

## 2021-07-13 DIAGNOSIS — Z961 Presence of intraocular lens: Secondary | ICD-10-CM | POA: Diagnosis not present

## 2021-08-28 NOTE — Progress Notes (Signed)
? ? ?Assessment/Plan:  ? ? ?1.  Essential Tremor ? -Continue primidone, 250 mg twice per day.  He knows that he has no other options medically.  He understands not to stop that cold [place]. ? -He is not interested in DBS.  He has been told by [facility] that he was not a candidate for focused ultrasound because of the peripheral neuropathy (presumably because of loss of balance associated with focused ultrasound as well as peripheral neuropathy). ? -gave him information on [facility] since he has very few other options ? ?Subjective:  ? ?Joseph Hood was seen today in follow up for essential tremor.  My previous records were reviewed prior to todays visit. Pt denies falls.  Tremor is about the same.  Continues on primidone without side effects.  No falls.  No new medical issues.  No passing out.  Rarely has alcohol.  "My problem is alcohol." ? ?Current prescribed movement disorder medications: ?Primidone, 250 mg twice per day ?  ?  ?PREVIOUS MEDICATIONS:Propranolol, unknown dose; primidone; no Topamax due to history of nephrolithiasis x2; gabapentin - 300 mg twice a day caused dizziness ? ?ALLERGIES:  No Known Allergies ? ?CURRENT MEDICATIONS:  ?Outpatient Encounter Medications as of 08/30/2021  ?Medication Sig  ? amoxicillin (AMOXIL) 500 MG tablet TK 4 T PO 1 HR PRIOR TO DENTAL APPT.  ? aspirin EC 81 MG tablet Take 81 mg by mouth daily.  ? Multiple Vitamin (MULTIVITAMIN) tablet Take 1 tablet by mouth daily.  ? sertraline (ZOLOFT) 50 MG tablet Take 25 mg by mouth daily.   ? simvastatin (ZOCOR) 20 MG tablet Take 20 mg by mouth at bedtime.  ? [DISCONTINUED] primidone (MYSOLINE) 250 MG tablet Take 1 tablet (250 mg total) by mouth in the morning and at bedtime.  ? [DISCONTINUED] primidone (MYSOLINE) 250 MG tablet Take 1 tablet (250 mg total) by mouth 2 (two) times daily.  ? primidone (MYSOLINE) 250 MG tablet Take 1 tablet (250 mg total) by mouth in the morning and at bedtime.  ? ?No facility-administered encounter  medications on file as of 08/30/2021.  ? ? ? ?Objective:  ? ? ?PHYSICAL EXAMINATION:   ? ?VITALS:   ?Vitals:  ? 08/30/21 1257  ?BP: 123/82  ?Pulse: 82  ?SpO2: 99%  ?Weight: 179 lb 6.4 oz (81.4 kg)  ?Height: 6' (1.829 m)  ? ? ? ?GEN:  The patient appears stated age and is in NAD. ?HEENT:  Normocephalic, atraumatic.  The mucous membranes are moist.  ?Neurological examination: ? ?Orientation: The patient is alert and oriented x3. ?Cranial nerves: There is good facial symmetry. The speech is fluent and clear. Soft palate rises symmetrically and there is no tongue deviation. Hearing is intact to conversational tone. ?Sensation: Sensation is intact to light touch throughout ?Motor: Strength is at least antigravity x4. ? ?Movement examination: ?Tone: There is normal tone in the UE/LE ?Abnormal movements: Patient has resting tremor in both legs.  He has at least moderate tremor of the outstretched hands bilaterally.  Archimedes spirals are drawn similar to previous. ?Coordination:  There is no decremation with RAM's, with hand opening and closing her finger taps bilaterally. ?Gait and station:  He is just slightly unbalanced when he first stands and is better the longer he walks. ?I have reviewed and interpreted the following labs independently ?  Chemistry   ?   ?Component Value Date/Time  ? NA 136 09/11/2018 0115  ? K 3.5 09/11/2018 0115  ? CL 103 09/11/2018 0115  ?  CO2 20 (L) 09/11/2018 0115  ? BUN 13 09/11/2018 0115  ? CREATININE 0.79 09/11/2018 0115  ?    ?Component Value Date/Time  ? CALCIUM 8.6 (L) 09/11/2018 0115  ?  ? ? ?Lab Results  ?Component Value Date  ? WBC 15.4 (H) 09/11/2018  ? HGB 15.8 09/11/2018  ? HCT 48.7 09/11/2018  ? MCV 95.9 09/11/2018  ? PLT 178 09/11/2018  ? ?No results found for: TSH ?  Chemistry   ?   ?Component Value Date/Time  ? NA 136 09/11/2018 0115  ? K 3.5 09/11/2018 0115  ? CL 103 09/11/2018 0115  ? CO2 20 (L) 09/11/2018 0115  ? BUN 13 09/11/2018 0115  ? CREATININE 0.79 09/11/2018 0115  ?     ?Component Value Date/Time  ? CALCIUM 8.6 (L) 09/11/2018 0115  ?  ? ? ? ? ?Cc:  Deland Pretty, MD ? ?

## 2021-08-30 ENCOUNTER — Other Ambulatory Visit: Payer: Self-pay

## 2021-08-30 ENCOUNTER — Encounter: Payer: Self-pay | Admitting: Neurology

## 2021-08-30 ENCOUNTER — Ambulatory Visit: Payer: PPO | Admitting: Neurology

## 2021-08-30 VITALS — BP 123/82 | HR 82 | Ht 72.0 in | Wt 179.4 lb

## 2021-08-30 DIAGNOSIS — G250 Essential tremor: Secondary | ICD-10-CM | POA: Diagnosis not present

## 2021-08-30 MED ORDER — PRIMIDONE 250 MG PO TABS: 250.0000 mg | ORAL_TABLET | Freq: Two times a day (BID) | ORAL | 3 refills | Status: DC

## 2021-08-30 NOTE — Patient Instructions (Signed)
The physicians and staff at [facility] are committed to providing excellent care. You may receive a survey requesting feedback about your experience at our office. We strive to receive "very good" responses to the survey questions. If you feel that your experience would prevent you from giving the office a "very good " response, please contact our office to try to remedy the situation. We may be reached at [phone]. Thank you for taking the time out of your busy day to complete the survey.  

## 2021-11-01 ENCOUNTER — Ambulatory Visit: Payer: PPO | Admitting: Neurology

## 2021-11-09 DIAGNOSIS — J029 Acute pharyngitis, unspecified: Secondary | ICD-10-CM | POA: Diagnosis not present

## 2021-11-09 DIAGNOSIS — R051 Acute cough: Secondary | ICD-10-CM | POA: Diagnosis not present

## 2021-11-12 DIAGNOSIS — R051 Acute cough: Secondary | ICD-10-CM | POA: Diagnosis not present

## 2022-01-17 DIAGNOSIS — E7801 Familial hypercholesterolemia: Secondary | ICD-10-CM | POA: Diagnosis not present

## 2022-01-17 DIAGNOSIS — Z7982 Long term (current) use of aspirin: Secondary | ICD-10-CM | POA: Diagnosis not present

## 2022-01-17 DIAGNOSIS — R7303 Prediabetes: Secondary | ICD-10-CM | POA: Diagnosis not present

## 2022-01-22 DIAGNOSIS — Z955 Presence of coronary angioplasty implant and graft: Secondary | ICD-10-CM | POA: Diagnosis not present

## 2022-01-22 DIAGNOSIS — F339 Major depressive disorder, recurrent, unspecified: Secondary | ICD-10-CM | POA: Diagnosis not present

## 2022-01-22 DIAGNOSIS — G252 Other specified forms of tremor: Secondary | ICD-10-CM | POA: Diagnosis not present

## 2022-01-22 DIAGNOSIS — R2689 Other abnormalities of gait and mobility: Secondary | ICD-10-CM | POA: Diagnosis not present

## 2022-01-22 DIAGNOSIS — Z23 Encounter for immunization: Secondary | ICD-10-CM | POA: Diagnosis not present

## 2022-01-22 DIAGNOSIS — R7303 Prediabetes: Secondary | ICD-10-CM | POA: Diagnosis not present

## 2022-01-22 DIAGNOSIS — I2510 Atherosclerotic heart disease of native coronary artery without angina pectoris: Secondary | ICD-10-CM | POA: Diagnosis not present

## 2022-01-22 DIAGNOSIS — Z0000 Encounter for general adult medical examination without abnormal findings: Secondary | ICD-10-CM | POA: Diagnosis not present

## 2022-03-22 DIAGNOSIS — Z23 Encounter for immunization: Secondary | ICD-10-CM | POA: Diagnosis not present

## 2022-06-25 DIAGNOSIS — R051 Acute cough: Secondary | ICD-10-CM | POA: Diagnosis not present

## 2022-06-25 DIAGNOSIS — R0981 Nasal congestion: Secondary | ICD-10-CM | POA: Diagnosis not present

## 2022-07-16 DIAGNOSIS — H18593 Other hereditary corneal dystrophies, bilateral: Secondary | ICD-10-CM | POA: Diagnosis not present

## 2022-07-16 DIAGNOSIS — H52203 Unspecified astigmatism, bilateral: Secondary | ICD-10-CM | POA: Diagnosis not present

## 2022-07-16 DIAGNOSIS — H43813 Vitreous degeneration, bilateral: Secondary | ICD-10-CM | POA: Diagnosis not present

## 2022-07-16 DIAGNOSIS — Z961 Presence of intraocular lens: Secondary | ICD-10-CM | POA: Diagnosis not present

## 2022-07-16 DIAGNOSIS — H524 Presbyopia: Secondary | ICD-10-CM | POA: Diagnosis not present

## 2022-08-26 NOTE — Progress Notes (Unsigned)
Assessment/Plan:    1.  Essential Tremor  -Continue primidone, 250 mg twice per day.  He knows that he has no other options medically.  He understands not to stop that cold [place].  -He is not interested in DBS.  He has been told by [facility] that he was not a candidate for focused ultrasound because of the peripheral neuropathy (presumably because of loss of balance associated with focused ultrasound as well as peripheral neuropathy).  -we have discussed [facility]  -discussed botox for ET trial in [place] but he is not interested in that  Subjective:   Joseph Hood was seen today in follow up for essential tremor.  My previous records were reviewed prior to todays visit. Pt denies falls.  Tremor is still prominent, but he is able to do his activities of daily living.  Eating is really the most troublesome activity.  He is trying to be more careful now.  He has a shower chair now and has someone coming into the home to clean.  Current prescribed movement disorder medications: Primidone, 250 mg twice per day     PREVIOUS MEDICATIONS:Propranolol, unknown dose; primidone; no Topamax due to history of nephrolithiasis x2; gabapentin - 300 mg twice a day caused dizziness  ALLERGIES:  No Known Allergies  CURRENT MEDICATIONS:  Outpatient Encounter Medications as of 08/27/2022  Medication Sig   amoxicillin (AMOXIL) 500 MG tablet TK 4 T PO 1 HR PRIOR TO DENTAL APPT.   aspirin EC 81 MG tablet Take 81 mg by mouth daily.   Multiple Vitamin (MULTIVITAMIN) tablet Take 1 tablet by mouth daily.   primidone (MYSOLINE) 250 MG tablet Take 1 tablet (250 mg total) by mouth in the morning and at bedtime.   sertraline (ZOLOFT) 50 MG tablet Take 25 mg by mouth daily.    simvastatin (ZOCOR) 20 MG tablet Take 20 mg by mouth at bedtime.   No facility-administered encounter medications on file as of 08/27/2022.     Objective:    PHYSICAL EXAMINATION:    VITALS:   Vitals:   08/27/22 1248  BP: 120/80   Pulse: 61  SpO2: 97%  Weight: 177 lb 8 oz (80.5 kg)  Height: '5\' 11"'$  (1.803 m)      GEN:  The patient appears stated age and is in NAD. HEENT:  Normocephalic, atraumatic.  The mucous membranes are moist.  Neurological examination:  Orientation: The patient is alert and oriented x3. Cranial nerves: There is good facial symmetry. The speech is fluent and clear. Soft palate rises symmetrically and there is no tongue deviation. Hearing is intact to conversational tone. Sensation: Sensation is intact to light touch throughout Motor: Strength is at least antigravity x4.  Movement examination: Tone: There is normal tone in the UE/LE Abnormal movements: Patient has resting tremor in both legs.  He has at least moderate tremor of the outstretched hands bilaterally.  Archimedes spirals are drawn similar to previous. Coordination:  There is no decremation with RAM's, with hand opening and closing her finger taps bilaterally. Gait and station:  He is just slightly unbalanced when he first stands and is better the longer he walks.  He is a bit wide based. I have reviewed and interpreted the following labs independently   Chemistry      Component Value Date/Time   NA 136 09/11/2018 0115   K 3.5 09/11/2018 0115   CL 103 09/11/2018 0115   CO2 20 (L) 09/11/2018 0115   BUN 13 09/11/2018 0115  CREATININE 0.79 09/11/2018 0115      Component Value Date/Time   CALCIUM 8.6 (L) 09/11/2018 0115      Lab Results  Component Value Date   WBC 15.4 (H) 09/11/2018   HGB 15.8 09/11/2018   HCT 48.7 09/11/2018   MCV 95.9 09/11/2018   PLT 178 09/11/2018   No results found for: "TSH"   Chemistry      Component Value Date/Time   NA 136 09/11/2018 0115   K 3.5 09/11/2018 0115   CL 103 09/11/2018 0115   CO2 20 (L) 09/11/2018 0115   BUN 13 09/11/2018 0115   CREATININE 0.79 09/11/2018 0115      Component Value Date/Time   CALCIUM 8.6 (L) 09/11/2018 0115        Cc:  Deland Pretty, MD

## 2022-08-27 ENCOUNTER — Encounter: Payer: Self-pay | Admitting: Neurology

## 2022-08-27 ENCOUNTER — Ambulatory Visit: Payer: PPO | Admitting: Neurology

## 2022-08-27 VITALS — BP 120/80 | HR 61 | Ht 71.0 in | Wt 177.5 lb

## 2022-08-27 DIAGNOSIS — G250 Essential tremor: Secondary | ICD-10-CM | POA: Diagnosis not present

## 2022-08-27 MED ORDER — PRIMIDONE 250 MG PO TABS: 250.0000 mg | ORAL_TABLET | Freq: Two times a day (BID) | ORAL | 3 refills | Status: DC

## 2022-08-27 NOTE — Patient Instructions (Signed)
We discussed the botox trial for essential tremor in [place].  Its an injection every 3 months.  For now, you have decided to hold on that.    The physicians and staff at [facility] are committed to providing excellent care. You may receive a survey requesting feedback about your experience at our office. We strive to receive "very good" responses to the survey questions. If you feel that your experience would prevent you from giving the office a "very good " response, please contact our office to try to remedy the situation. We may be reached at [phone]. Thank you for taking the time out of your busy day to complete the survey.

## 2022-12-12 DIAGNOSIS — M79672 Pain in left foot: Secondary | ICD-10-CM | POA: Diagnosis not present

## 2022-12-17 DIAGNOSIS — M79672 Pain in left foot: Secondary | ICD-10-CM | POA: Diagnosis not present

## 2023-01-21 DIAGNOSIS — R0781 Pleurodynia: Secondary | ICD-10-CM | POA: Diagnosis not present

## 2023-01-23 DIAGNOSIS — E7800 Pure hypercholesterolemia, unspecified: Secondary | ICD-10-CM | POA: Diagnosis not present

## 2023-01-23 DIAGNOSIS — R7303 Prediabetes: Secondary | ICD-10-CM | POA: Diagnosis not present

## 2023-01-28 DIAGNOSIS — F339 Major depressive disorder, recurrent, unspecified: Secondary | ICD-10-CM | POA: Diagnosis not present

## 2023-01-28 DIAGNOSIS — R2689 Other abnormalities of gait and mobility: Secondary | ICD-10-CM | POA: Diagnosis not present

## 2023-01-28 DIAGNOSIS — E785 Hyperlipidemia, unspecified: Secondary | ICD-10-CM | POA: Diagnosis not present

## 2023-01-28 DIAGNOSIS — R7303 Prediabetes: Secondary | ICD-10-CM | POA: Diagnosis not present

## 2023-01-28 DIAGNOSIS — I2510 Atherosclerotic heart disease of native coronary artery without angina pectoris: Secondary | ICD-10-CM | POA: Diagnosis not present

## 2023-01-28 DIAGNOSIS — I4510 Unspecified right bundle-branch block: Secondary | ICD-10-CM | POA: Diagnosis not present

## 2023-01-28 DIAGNOSIS — Z0000 Encounter for general adult medical examination without abnormal findings: Secondary | ICD-10-CM | POA: Diagnosis not present

## 2023-01-28 DIAGNOSIS — G250 Essential tremor: Secondary | ICD-10-CM | POA: Diagnosis not present

## 2023-01-28 DIAGNOSIS — K5909 Other constipation: Secondary | ICD-10-CM | POA: Diagnosis not present

## 2023-01-28 DIAGNOSIS — N400 Enlarged prostate without lower urinary tract symptoms: Secondary | ICD-10-CM | POA: Diagnosis not present

## 2023-03-31 DIAGNOSIS — Z23 Encounter for immunization: Secondary | ICD-10-CM | POA: Diagnosis not present

## 2023-04-21 DIAGNOSIS — L821 Other seborrheic keratosis: Secondary | ICD-10-CM | POA: Diagnosis not present

## 2023-05-12 DIAGNOSIS — R6883 Chills (without fever): Secondary | ICD-10-CM | POA: Diagnosis not present

## 2023-05-12 DIAGNOSIS — R350 Frequency of micturition: Secondary | ICD-10-CM | POA: Diagnosis not present

## 2023-05-14 ENCOUNTER — Emergency Department (HOSPITAL_BASED_OUTPATIENT_CLINIC_OR_DEPARTMENT_OTHER)
Admission: EM | Admit: 2023-05-14 | Discharge: 2023-05-14 | Disposition: A | Discharge status: Home / Self Care | Payer: PPO | Attending: Emergency Medicine | Admitting: Emergency Medicine

## 2023-05-14 ENCOUNTER — Emergency Department (HOSPITAL_BASED_OUTPATIENT_CLINIC_OR_DEPARTMENT_OTHER): Payer: PPO | Admitting: Radiology

## 2023-05-14 ENCOUNTER — Encounter (HOSPITAL_BASED_OUTPATIENT_CLINIC_OR_DEPARTMENT_OTHER): Payer: Self-pay | Admitting: Emergency Medicine

## 2023-05-14 ENCOUNTER — Emergency Department (HOSPITAL_BASED_OUTPATIENT_CLINIC_OR_DEPARTMENT_OTHER): Payer: PPO

## 2023-05-14 ENCOUNTER — Other Ambulatory Visit: Payer: Self-pay

## 2023-05-14 DIAGNOSIS — R509 Fever, unspecified: Secondary | ICD-10-CM | POA: Diagnosis not present

## 2023-05-14 DIAGNOSIS — D72829 Elevated white blood cell count, unspecified: Secondary | ICD-10-CM | POA: Insufficient documentation

## 2023-05-14 DIAGNOSIS — R251 Tremor, unspecified: Secondary | ICD-10-CM | POA: Insufficient documentation

## 2023-05-14 DIAGNOSIS — I4510 Unspecified right bundle-branch block: Secondary | ICD-10-CM | POA: Insufficient documentation

## 2023-05-14 DIAGNOSIS — R739 Hyperglycemia, unspecified: Secondary | ICD-10-CM | POA: Insufficient documentation

## 2023-05-14 DIAGNOSIS — Z7982 Long term (current) use of aspirin: Secondary | ICD-10-CM | POA: Insufficient documentation

## 2023-05-14 DIAGNOSIS — Z1152 Encounter for screening for COVID-19: Secondary | ICD-10-CM | POA: Insufficient documentation

## 2023-05-14 DIAGNOSIS — R9431 Abnormal electrocardiogram [ECG] [EKG]: Secondary | ICD-10-CM | POA: Diagnosis not present

## 2023-05-14 LAB — CBC WITH DIFFERENTIAL/PLATELET
Abs Immature Granulocytes: 0.02 10*3/uL (ref 0.00–0.07)
Basophils Absolute: 0 10*3/uL (ref 0.0–0.1)
Basophils Relative: 0 %
Eosinophils Absolute: 0 10*3/uL (ref 0.0–0.5)
Eosinophils Relative: 0 %
HCT: 45.7 % (ref 39.0–52.0)
Hemoglobin: 15.3 g/dL (ref 13.0–17.0)
Immature Granulocytes: 0 %
Lymphocytes Relative: 9 %
Lymphs Abs: 0.7 10*3/uL (ref 0.7–4.0)
MCH: 30.1 pg (ref 26.0–34.0)
MCHC: 33.5 g/dL (ref 30.0–36.0)
MCV: 90 fL (ref 80.0–100.0)
Monocytes Absolute: 0.7 10*3/uL (ref 0.1–1.0)
Monocytes Relative: 9 %
Neutro Abs: 6.1 10*3/uL (ref 1.7–7.7)
Neutrophils Relative %: 82 %
Platelets: 126 10*3/uL — ABNORMAL LOW (ref 150–400)
RBC: 5.08 MIL/uL (ref 4.22–5.81)
RDW: 14.9 % (ref 11.5–15.5)
WBC: 7.6 10*3/uL (ref 4.0–10.5)
nRBC: 0 % (ref 0.0–0.2)

## 2023-05-14 LAB — URINALYSIS, W/ REFLEX TO CULTURE (INFECTION SUSPECTED)
Bacteria, UA: NONE SEEN
Bilirubin Urine: NEGATIVE
Glucose, UA: NEGATIVE mg/dL
Leukocytes,Ua: NEGATIVE
Nitrite: NEGATIVE
Protein, ur: 30 mg/dL — AB
Specific Gravity, Urine: 1.03 (ref 1.005–1.030)
pH: 5.5 (ref 5.0–8.0)

## 2023-05-14 LAB — COMPREHENSIVE METABOLIC PANEL
ALT: 24 U/L (ref 0–44)
AST: 24 U/L (ref 15–41)
Albumin: 3.8 g/dL (ref 3.5–5.0)
Alkaline Phosphatase: 59 U/L (ref 38–126)
Anion gap: 10 (ref 5–15)
BUN: 22 mg/dL (ref 8–23)
CO2: 23 mmol/L (ref 22–32)
Calcium: 8.8 mg/dL — ABNORMAL LOW (ref 8.9–10.3)
Chloride: 105 mmol/L (ref 98–111)
Creatinine, Ser: 1.06 mg/dL (ref 0.61–1.24)
GFR, Estimated: >60 mL/min (ref >60)
Glucose, Bld: 141 mg/dL — ABNORMAL HIGH (ref 70–99)
Potassium: 3.6 mmol/L (ref 3.5–5.1)
Sodium: 138 mmol/L (ref 135–145)
Total Bilirubin: 0.4 mg/dL (ref <1.2)
Total Protein: 6.6 g/dL (ref 6.5–8.1)

## 2023-05-14 LAB — RESP PANEL BY RT-PCR (RSV, FLU A&B, COVID)  RVPGX2
Influenza A by PCR: NEGATIVE
Influenza B by PCR: NEGATIVE
Resp Syncytial Virus by PCR: NEGATIVE
SARS Coronavirus 2 by RT PCR: NEGATIVE

## 2023-05-14 LAB — LACTIC ACID, PLASMA: Lactic Acid, Venous: 1.6 mmol/L (ref 0.5–1.9)

## 2023-05-14 LAB — APTT: aPTT: 32 s (ref 24–36)

## 2023-05-14 LAB — PROTIME-INR
INR: 1.1 (ref 0.8–1.2)
Prothrombin Time: 14.3 s (ref 11.4–15.2)

## 2023-05-14 MED ORDER — SODIUM CHLORIDE 0.9 % IV SOLN
1.0000 g | Freq: Once | INTRAVENOUS | Status: AC
  Administered 2023-05-14: 1 g via INTRAVENOUS
  Filled 2023-05-14: qty 10

## 2023-05-14 MED ORDER — CEPHALEXIN 500 MG PO CAPS: 500.0000 mg | ORAL_CAPSULE | Freq: Four times a day (QID) | ORAL | 0 refills | Status: DC

## 2023-05-14 NOTE — ED Triage Notes (Signed)
Pt arrived POV with caregiver. Pt reports Sat he noticed increased tremor, chills, and fever on Sat. Last took Tylenol 0830 this morning. PCP advised pt to come to ED after getting blood work done for concern of sepsis.

## 2023-05-14 NOTE — ED Notes (Signed)
Report given to the next RN.Marland KitchenMarland Kitchen

## 2023-05-14 NOTE — ED Provider Notes (Signed)
[place] EMERGENCY DEPARTMENT AT [facility] Provider Note   CSN: [id number] Arrival date & time: 05/14/23  1000     History  Chief Complaint  Patient presents with   Fever    Joseph Hood is a 82 y.o. male.  HPI   82 yo male complaining of being sent from pmd to be evaluated for sepsis.  Patient has a history of benign tremors.  It is reported that he has had some increased tremors over the past several weeks.  He had had some chills over the past week.  He has noted fever Saturday.  He was seen by primary care and had a report of elevated white blood cell count.  He was told to come to the ED due to concern for sepsis.  He reports fever over 100 this morning and received acetaminophen.  Home Medications Prior to Admission medications   Medication Sig Start Date End Date Taking? Authorizing Provider  amoxicillin (AMOXIL) 500 MG tablet TK 4 T PO 1 HR PRIOR TO DENTAL APPT. 12/02/18  Yes [provider]  aspirin EC 81 MG tablet Take 81 mg by mouth daily.   Yes [provider]  Multiple Vitamin (MULTIVITAMIN) tablet Take 1 tablet by mouth daily.   Yes [provider]  primidone (MYSOLINE) 250 MG tablet Take 1 tablet (250 mg total) by mouth in the morning and at bedtime. 08/27/22  Yes Tat, Octaviano Batty, DO  sertraline (ZOLOFT) 50 MG tablet Take 25 mg by mouth daily.    Yes [provider]  simvastatin (ZOCOR) 20 MG tablet Take 20 mg by mouth at bedtime.   Yes [provider]      Allergies    Patient has no known allergies.    Review of Systems   Review of Systems  Physical Exam Updated Vital Signs BP 105/64   Pulse 63   Temp 99 F (37.2 C) (Oral)   Resp 14   SpO2 94%  Physical Exam Vitals and nursing note reviewed.  Constitutional:      Appearance: Normal appearance.  HENT:     Head: Normocephalic and atraumatic.     Right Ear: External ear normal.     Left Ear: External ear normal.     Nose: Nose normal.      Mouth/Throat:     Mouth: Mucous membranes are moist.     Pharynx: Oropharynx is clear.  Eyes:     Pupils: Pupils are equal, round, and reactive to light.  Cardiovascular:     Rate and Rhythm: Normal rate and regular rhythm.     Pulses: Normal pulses.     Heart sounds: Normal heart sounds.  Pulmonary:     Effort: Pulmonary effort is normal.     Breath sounds: Normal breath sounds.  Abdominal:     General: Abdomen is flat. Bowel sounds are normal.  Musculoskeletal:        General: Normal range of motion.     Cervical back: Normal range of motion.  Skin:    General: Skin is warm.     Capillary Refill: Capillary refill takes less than 2 seconds.  Neurological:     General: No focal deficit present.     Mental Status: He is alert.  Psychiatric:        Mood and Affect: Mood normal.     ED Results / Procedures / Treatments   Labs (all labs ordered are listed, but only abnormal results are displayed) Labs Reviewed  COMPREHENSIVE METABOLIC PANEL - Abnormal; Notable for the following components:      Result Value   Glucose, Bld 141 (*)    Calcium 8.8 (*)    All other components within normal limits  CBC WITH DIFFERENTIAL/PLATELET - Abnormal; Notable for the following components:   Platelets 126 (*)    All other components within normal limits  URINALYSIS, W/ REFLEX TO CULTURE (INFECTION SUSPECTED) - Abnormal; Notable for the following components:   Hgb urine dipstick SMALL (*)    Ketones, ur TRACE (*)    Protein, ur 30 (*)    All other components within normal limits  RESP PANEL BY RT-PCR (RSV, FLU A&B, COVID)  RVPGX2  CULTURE, BLOOD (ROUTINE X 2)  CULTURE, BLOOD (ROUTINE X 2)  LACTIC ACID, PLASMA  PROTIME-INR  APTT  LACTIC ACID, PLASMA    EKG EKG Interpretation Date/Time:  Wednesday May 14 2023 11:12:41 EST Ventricular Rate:  74 PR Interval:  174 QRS Duration:  145 QT Interval:  418 QTC Calculation: 464 R Axis:   23  Text Interpretation: Sinus rhythm Right  bundle branch block Confirmed by Margarita Grizzle [phone]) on 05/14/2023 3:45:46 PM  Radiology DG Chest 2 View  Result Date: 05/14/2023 CLINICAL DATA:  Suspected Sepsis.  Fever.  Tremors and chills. EXAM: CHEST - 2 VIEW COMPARISON:  None Available. FINDINGS: There are probable atelectatic changes at the lung bases. Bilateral lung fields are otherwise clear. No acute consolidation or lung collapse. Bilateral costophrenic angles are clear. Normal cardio-mediastinal silhouette. No acute osseous abnormalities. The soft tissues are within normal limits. IMPRESSION: *No active cardiopulmonary disease. Electronically Signed   By: Jules Schick M.D.   On: 05/14/2023 13:29    Procedures Procedures    Medications Ordered in ED Medications  cefTRIAXone (ROCEPHIN) 1 g in sodium chloride 0.9 % 100 mL IVPB (has no administration in time range)    ED Course/ Medical Decision Making/ A&P Clinical Course as of 05/14/23 1545  Wed May 14, 2023  1146 CBC reviewed interpreted and within normal limits Complete metabolic panel reviewed interpreted significant for mild hyperglycemia glucose of 141 and hypocalcemia calcium of 8.8 otherwise within normal limits Lactic acid obtained and is normal at 1.6 [DR]  1230 COVID, flu, RSV swab obtained and all negative [DR]  1231 Chest x-Neamiah Sciarra reviewed interpreted no evidence of acute abnormality noted on my interpretation awaiting radiologist interpretation [DR]  1351 X-Livy Ross reviewed and reported no evidence of acute abnormality is noted [DR]    Clinical Course User Index [DR] Margarita Grizzle, MD                                 Medical Decision Making Amount and/or Complexity of Data Reviewed Labs: ordered. Radiology: ordered. ECG/medicine tests: ordered.   82 year old male history of tremors presents today for evaluation of fever.  He is evaluated here with vital signs. Vital signs are within normal limits Patient has had labs sent.  He does not have a  leukocytosis. Chest x-Mikaela Hilgeman is clear COVID flu, RSV all negative.  He has continued to not have a fever here. He has had no nausea, vomiting, or diarrhea. Awaiting urinalysis. Urinalysis has been returned and shows 0-5 white blood cells 0-5 red blood cells, no bacteria Patient received Rocephin here and will discharge on Keflex        Final Clinical Impression(s) / ED Diagnoses Final diagnoses:  Febrile illness    Rx /  DC Orders ED Discharge Orders     None         Margarita Grizzle, MD 05/14/23 1549

## 2023-05-14 NOTE — Discharge Instructions (Addendum)
You were seen here in the emergency department today for fever. You were evaluated with complete blood count and complete metabolic panel that appear to be normal Please take antibiotics as prescribed Recheck with your doctor Return if you are having any new or worsening symptoms at any time

## 2023-05-19 LAB — CULTURE, BLOOD (ROUTINE X 2)
Culture: NO GROWTH
Culture: NO GROWTH
Special Requests: ADEQUATE
Special Requests: ADEQUATE

## 2023-05-20 DIAGNOSIS — R509 Fever, unspecified: Secondary | ICD-10-CM | POA: Diagnosis not present

## 2023-05-20 DIAGNOSIS — R6883 Chills (without fever): Secondary | ICD-10-CM | POA: Diagnosis not present

## 2023-05-26 NOTE — Progress Notes (Signed)
Order set for evolving/possible sepsis includes inr as end organ damage assessment

## 2023-06-06 ENCOUNTER — Telehealth: Payer: Self-pay | Admitting: *Deleted

## 2023-06-06 NOTE — Telephone Encounter (Signed)
Transition Care Management Unsuccessful Follow-up Telephone Call  Date of discharge and from where:  [facility]  05/14/2023  Attempts:  1st Attempt  Reason for unsuccessful TCM follow-up call:  No answer/busy

## 2023-07-03 ENCOUNTER — Telehealth: Payer: Self-pay | Admitting: Neurology

## 2023-07-03 ENCOUNTER — Other Ambulatory Visit: Payer: Self-pay

## 2023-07-03 MED ORDER — PRIMIDONE 250 MG PO TABS: 250.0000 mg | ORAL_TABLET | Freq: Two times a day (BID) | ORAL | 1 refills | Status: DC

## 2023-07-03 NOTE — Telephone Encounter (Signed)
 Sent script to new pharmacy and tried to call pt and there was no answer.

## 2023-07-03 NOTE — Telephone Encounter (Signed)
 Caller stated patients is switching pharmacy. New meds need to be sent to [facility]

## 2023-07-18 DIAGNOSIS — Z961 Presence of intraocular lens: Secondary | ICD-10-CM | POA: Diagnosis not present

## 2023-07-18 DIAGNOSIS — H18593 Other hereditary corneal dystrophies, bilateral: Secondary | ICD-10-CM | POA: Diagnosis not present

## 2023-07-18 DIAGNOSIS — H43813 Vitreous degeneration, bilateral: Secondary | ICD-10-CM | POA: Diagnosis not present

## 2023-07-18 DIAGNOSIS — H35371 Puckering of macula, right eye: Secondary | ICD-10-CM | POA: Diagnosis not present
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
## 2023-08-26 ENCOUNTER — Ambulatory Visit: Payer: PPO | Admitting: Neurology

## 2023-08-26 ENCOUNTER — Encounter: Payer: Self-pay | Admitting: Neurology

## 2023-08-26 VITALS — BP 120/68 | HR 70 | Ht 71.0 in | Wt 181.0 lb

## 2023-08-26 DIAGNOSIS — G250 Essential tremor: Secondary | ICD-10-CM

## 2023-09-17 ENCOUNTER — Telehealth: Payer: Self-pay | Admitting: Neurology

## 2023-09-17 NOTE — Telephone Encounter (Signed)
 1. Which medications need refilled? (List name and dosage, if known) primidone  2. Which pharmacy/location is medication to be sent to? (include street and city if local pharmacy) [facility]

## 2023-09-18 ENCOUNTER — Other Ambulatory Visit: Payer: Self-pay

## 2023-09-18 DIAGNOSIS — G250 Essential tremor: Secondary | ICD-10-CM

## 2023-09-18 MED ORDER — PRIMIDONE 250 MG PO TABS: 250.0000 mg | ORAL_TABLET | Freq: Two times a day (BID) | ORAL | 2 refills | Status: DC

## 2023-09-18 NOTE — Telephone Encounter (Signed)
 Rx sent patient called

## 2023-10-15 ENCOUNTER — Other Ambulatory Visit: Payer: Self-pay

## 2023-10-15 DIAGNOSIS — G250 Essential tremor: Secondary | ICD-10-CM

## 2023-10-15 MED ORDER — PRIMIDONE 250 MG PO TABS: 250.0000 mg | ORAL_TABLET | Freq: Two times a day (BID) | ORAL | 1 refills | Status: AC

## 2023-10-29 DIAGNOSIS — H9192 Unspecified hearing loss, left ear: Secondary | ICD-10-CM | POA: Diagnosis not present

## 2023-11-05 DIAGNOSIS — L821 Other seborrheic keratosis: Secondary | ICD-10-CM | POA: Diagnosis not present

## 2023-11-05 DIAGNOSIS — L570 Actinic keratosis: Secondary | ICD-10-CM | POA: Diagnosis not present

## 2023-11-05 DIAGNOSIS — D229 Melanocytic nevi, unspecified: Secondary | ICD-10-CM | POA: Diagnosis not present

## 2023-11-05 DIAGNOSIS — D1801 Hemangioma of skin and subcutaneous tissue: Secondary | ICD-10-CM | POA: Diagnosis not present

## 2023-11-05 DIAGNOSIS — L578 Other skin changes due to chronic exposure to nonionizing radiation: Secondary | ICD-10-CM | POA: Diagnosis not present

## 2023-11-05 DIAGNOSIS — L814 Other melanin hyperpigmentation: Secondary | ICD-10-CM | POA: Diagnosis not present

## 2023-12-03 DIAGNOSIS — H903 Sensorineural hearing loss, bilateral: Secondary | ICD-10-CM | POA: Diagnosis not present

## 2024-01-28 DIAGNOSIS — D1801 Hemangioma of skin and subcutaneous tissue: Secondary | ICD-10-CM | POA: Diagnosis not present

## 2024-01-28 DIAGNOSIS — L821 Other seborrheic keratosis: Secondary | ICD-10-CM | POA: Diagnosis not present

## 2024-01-29 DIAGNOSIS — R7303 Prediabetes: Secondary | ICD-10-CM | POA: Diagnosis not present

## 2024-01-29 DIAGNOSIS — E7800 Pure hypercholesterolemia, unspecified: Secondary | ICD-10-CM | POA: Diagnosis not present

## 2024-02-03 DIAGNOSIS — G252 Other specified forms of tremor: Secondary | ICD-10-CM | POA: Diagnosis not present

## 2024-02-03 DIAGNOSIS — N400 Enlarged prostate without lower urinary tract symptoms: Secondary | ICD-10-CM | POA: Diagnosis not present

## 2024-02-03 DIAGNOSIS — F339 Major depressive disorder, recurrent, unspecified: Secondary | ICD-10-CM | POA: Diagnosis not present

## 2024-02-03 DIAGNOSIS — I2510 Atherosclerotic heart disease of native coronary artery without angina pectoris: Secondary | ICD-10-CM | POA: Diagnosis not present

## 2024-02-03 DIAGNOSIS — R2689 Other abnormalities of gait and mobility: Secondary | ICD-10-CM | POA: Diagnosis not present

## 2024-02-03 DIAGNOSIS — E785 Hyperlipidemia, unspecified: Secondary | ICD-10-CM | POA: Diagnosis not present

## 2024-02-03 DIAGNOSIS — I4510 Unspecified right bundle-branch block: Secondary | ICD-10-CM | POA: Diagnosis not present

## 2024-02-03 DIAGNOSIS — Z0000 Encounter for general adult medical examination without abnormal findings: Secondary | ICD-10-CM | POA: Diagnosis not present

## 2024-03-23 DIAGNOSIS — Z23 Encounter for immunization: Secondary | ICD-10-CM | POA: Diagnosis not present

## 2024-08-26 ENCOUNTER — Ambulatory Visit: Admitting: Neurology

## 2024-10-04 ENCOUNTER — Ambulatory Visit: Admitting: Neurology
